# Patient Record
Sex: Male | Born: 1955 | Race: White | Hispanic: No | Marital: Single | Smoking: Never smoker
Health system: Southern US, Community
[De-identification: ages and names within clinical notes are randomized; demographics above are authoritative.]

---

## 2009-01-25 ENCOUNTER — Ambulatory Visit: Payer: Self-pay | Admitting: Family Medicine

## 2009-01-25 DIAGNOSIS — M79609 Pain in unspecified limb: Secondary | ICD-10-CM

## 2009-01-25 DIAGNOSIS — L851 Acquired keratosis [keratoderma] palmaris et plantaris: Secondary | ICD-10-CM

## 2009-01-25 DIAGNOSIS — L259 Unspecified contact dermatitis, unspecified cause: Secondary | ICD-10-CM

## 2009-05-18 ENCOUNTER — Ambulatory Visit: Payer: Self-pay | Admitting: Family Medicine

## 2009-05-18 DIAGNOSIS — R079 Chest pain, unspecified: Secondary | ICD-10-CM

## 2009-05-18 DIAGNOSIS — B360 Pityriasis versicolor: Secondary | ICD-10-CM

## 2009-05-24 ENCOUNTER — Encounter: Payer: Self-pay | Admitting: Family Medicine

## 2015-11-16 ENCOUNTER — Other Ambulatory Visit: Payer: Self-pay

## 2015-11-16 ENCOUNTER — Emergency Department (HOSPITAL_COMMUNITY)
Admission: EM | Admit: 2015-11-16 | Discharge: 2015-11-16 | Disposition: A | Discharge status: Home / Self Care | Payer: BLUE CROSS/BLUE SHIELD | Attending: Emergency Medicine | Admitting: Emergency Medicine

## 2015-11-16 ENCOUNTER — Emergency Department (HOSPITAL_COMMUNITY): Payer: BLUE CROSS/BLUE SHIELD

## 2015-11-16 ENCOUNTER — Encounter (HOSPITAL_COMMUNITY): Payer: Self-pay

## 2015-11-16 DIAGNOSIS — R079 Chest pain, unspecified: Secondary | ICD-10-CM | POA: Diagnosis present

## 2015-11-16 LAB — BASIC METABOLIC PANEL
Anion gap: 10 (ref 5–15)
BUN: 14 mg/dL (ref 6–20)
CALCIUM: 9.2 mg/dL (ref 8.9–10.3)
CO2: 21 mmol/L — ABNORMAL LOW (ref 22–32)
CREATININE: 1.2 mg/dL (ref 0.61–1.24)
Chloride: 106 mmol/L (ref 101–111)
GFR calc non Af Amer: >60 mL/min (ref >60)
Glucose, Bld: 110 mg/dL — ABNORMAL HIGH (ref 65–99)
Potassium: 3.8 mmol/L (ref 3.5–5.1)
SODIUM: 137 mmol/L (ref 135–145)

## 2015-11-16 LAB — CBC
HCT: 42.1 % (ref 39.0–52.0)
Hemoglobin: 14 g/dL (ref 13.0–17.0)
MCH: 30.2 pg (ref 26.0–34.0)
MCHC: 33.3 g/dL (ref 30.0–36.0)
MCV: 90.9 fL (ref 78.0–100.0)
PLATELETS: 120 10*3/uL — AB (ref 150–400)
RBC: 4.63 MIL/uL (ref 4.22–5.81)
RDW: 13.9 % (ref 11.5–15.5)
WBC: 3.9 10*3/uL — AB (ref 4.0–10.5)

## 2015-11-16 LAB — TROPONIN I

## 2015-11-16 MED ORDER — PANTOPRAZOLE SODIUM 20 MG PO TBEC: 20.0000 mg | DELAYED_RELEASE_TABLET | Freq: Every day | ORAL | Status: AC

## 2015-11-16 NOTE — ED Notes (Signed)
Per [facility]: Pt woke up around 0230 stated he had chest pain, central, non radiating. Pt had same symptoms on Saturday, went to urgent care and they stated that he needed to follow up for a stress test. States pain is pressure.  EMS gave 1 nitro, this resolved all pain.  No medical history.  No shortness of breath.

## 2015-11-16 NOTE — ED Notes (Signed)
Patient transported to X-ray 

## 2015-11-16 NOTE — Discharge Instructions (Signed)
Nonspecific Chest Pain  °Chest pain can be caused by many different conditions. There is always a chance that your pain could be related to something serious, such as a heart attack or a blood clot in your lungs. Chest pain can also be caused by conditions that are not life-threatening. If you have chest pain, it is very important to follow up with your health care provider. °CAUSES  °Chest pain can be caused by: °· Heartburn. °· Pneumonia or bronchitis. °· Anxiety or stress. °· Inflammation around your heart (pericarditis) or lung (pleuritis or pleurisy). °· A blood clot in your lung. °· A collapsed lung (pneumothorax). It can develop suddenly on its own (spontaneous pneumothorax) or from trauma to the chest. °· Shingles infection (varicella-zoster virus). °· Heart attack. °· Damage to the bones, muscles, and cartilage that make up your chest wall. This can include: °¨ Bruised bones due to injury. °¨ Strained muscles or cartilage due to frequent or repeated coughing or overwork. °¨ Fracture to one or more ribs. °¨ Sore cartilage due to inflammation (costochondritis). °RISK FACTORS  °Risk factors for chest pain may include: °· Activities that increase your risk for trauma or injury to your chest. °· Respiratory infections or conditions that cause frequent coughing. °· Medical conditions or overeating that can cause heartburn. °· Heart disease or family history of heart disease. °· Conditions or health behaviors that increase your risk of developing a blood clot. °· Having had chicken pox (varicella zoster). °SIGNS AND SYMPTOMS °Chest pain can feel like: °· Burning or tingling on the surface of your chest or deep in your chest. °· Crushing, pressure, aching, or squeezing pain. °· Dull or sharp pain that is worse when you move, cough, or take a deep breath. °· Pain that is also felt in your back, neck, shoulder, or arm, or pain that spreads to any of these areas. °Your chest pain may come and go, or it may stay  constant. °DIAGNOSIS °Lab tests or other studies may be needed to find the cause of your pain. Your health care provider may have you take a test called an ambulatory ECG (electrocardiogram). An ECG records your heartbeat patterns at the time the test is performed. You may also have other tests, such as: °· Transthoracic echocardiogram (TTE). During echocardiography, sound waves are used to create a picture of all of the heart structures and to look at how blood flows through your heart. °· Transesophageal echocardiogram (TEE). This is a more advanced imaging test that obtains images from inside your body. It allows your health care provider to see your heart in finer detail. °· Cardiac monitoring. This allows your health care provider to monitor your heart rate and rhythm in real time. °· Holter monitor. This is a portable device that records your heartbeat and can help to diagnose abnormal heartbeats. It allows your health care provider to track your heart activity for several days, if needed. °· Stress tests. These can be done through exercise or by taking medicine that makes your heart beat more quickly. °· Blood tests. °· Imaging tests. °TREATMENT  °Your treatment depends on what is causing your chest pain. Treatment may include: °· Medicines. These may include: °¨ Acid blockers for heartburn. °¨ Anti-inflammatory medicine. °¨ Pain medicine for inflammatory conditions. °¨ Antibiotic medicine, if an infection is present. °¨ Medicines to dissolve blood clots. °¨ Medicines to treat coronary artery disease. °· Supportive care for conditions that do not require medicines. This may include: °¨ Resting. °¨ Applying heat   or cold packs to injured areas. °¨ Limiting activities until pain decreases. °HOME CARE INSTRUCTIONS °· If you were prescribed an antibiotic medicine, finish it all even if you start to feel better. °· Avoid any activities that bring on chest pain. °· Do not use any tobacco products, including  cigarettes, chewing tobacco, or electronic cigarettes. If you need help quitting, ask your health care provider. °· Do not drink alcohol. °· Take medicines only as directed by your health care provider. °· Keep all follow-up visits as directed by your health care provider. This is important. This includes any further testing if your chest pain does not go away. °· If heartburn is the cause for your chest pain, you may be told to keep your head raised (elevated) while sleeping. This reduces the chance that acid will go from your stomach into your esophagus. °· Make lifestyle changes as directed by your health care provider. These may include: °¨ Getting regular exercise. Ask your health care provider to suggest some activities that are safe for you. °¨ Eating a heart-healthy diet. A registered dietitian can help you to learn healthy eating options. °¨ Maintaining a healthy weight. °¨ Managing diabetes, if necessary. °¨ Reducing stress. °SEEK MEDICAL CARE IF: °· Your chest pain does not go away after treatment. °· You have a rash with blisters on your chest. °· You have a fever. °SEEK IMMEDIATE MEDICAL CARE IF:  °· Your chest pain is worse. °· You have an increasing cough, or you cough up blood. °· You have severe abdominal pain. °· You have severe weakness. °· You faint. °· You have chills. °· You have sudden, unexplained chest discomfort. °· You have sudden, unexplained discomfort in your arms, back, neck, or jaw. °· You have shortness of breath at any time. °· You suddenly start to sweat, or your skin gets clammy. °· You feel nauseous or you vomit. °· You suddenly feel light-headed or dizzy. °· Your heart begins to beat quickly, or it feels like it is skipping beats. °These symptoms may represent a serious problem that is an emergency. Do not wait to see if the symptoms will go away. Get medical help right away. Call your local emergency services (911 in the [place] Do not drive yourself to the hospital. °  °This  information is not intended to replace advice given to you by your health care provider. Make sure you discuss any questions you have with your health care provider. °  °Document Released: 05/17/2005 Document Revised: 08/28/2014 Document Reviewed: 03/13/2014 °Elsevier Interactive Patient Education ©2016 [employer] ° °

## 2015-11-16 NOTE — ED Provider Notes (Signed)
CSN: [id number]     Arrival date & time 11/16/15  0413 History   First MD Initiated Contact with Patient 11/16/15 [phone]     Chief Complaint  Patient presents with  . Chest Pain     (Consider location/radiation/quality/duration/timing/severity/associated sxs/prior Treatment) HPI Comments: Patient presents to the ER for evaluation of chest pain. Patient was seen at urgent care several days ago when he developed chest pain. Patient reports that he had a pressure in his chest that only lasted for approximately 5 minutes. He had blood work and a chest x-ray performed, EKG. He was told that he would need follow-up and they are apparently arranging for outpatient stress test for this week. He was told to come to the ER, however, if the had any further pain. Patient developed pain today once again. He called EMS and was given nitroglycerin, pain did resolve. He has not had any further pain since. Patient denies heart palpitations, shortness of breath, nausea, diaphoresis associated with the pain.  Patient is a [age] y.o. male presenting with chest pain.  Chest Pain   History reviewed. No pertinent past medical history. History reviewed. No pertinent past surgical history. No family history on file. Social History  Substance Use Topics  . Smoking status: Never Smoker   . Smokeless tobacco: Current User  . Alcohol Use: Yes     Comment: 3-4 times a week     Review of Systems  Cardiovascular: Positive for chest pain.  All other systems reviewed and are negative.     Allergies  Review of patient's allergies indicates no known allergies.  Home Medications   Prior to Admission medications   Not on File   BP 121/93 mmHg  Pulse 62  Temp(Src) 98.4 F (36.9 C) (Oral)  Resp 14  SpO2 95% Physical Exam  Constitutional: He is oriented to person, place, and time. He appears well-developed and well-nourished. No distress.  HENT:  Head: Normocephalic and atraumatic.  Right Ear: Hearing normal.   Left Ear: Hearing normal.  Nose: Nose normal.  Mouth/Throat: Oropharynx is clear and moist and mucous membranes are normal.  Eyes: Conjunctivae and EOM are normal. Pupils are equal, round, and reactive to light.  Neck: Normal range of motion. Neck supple.  Cardiovascular: Regular rhythm, S1 normal and S2 normal.  Exam reveals no gallop and no friction rub.   No murmur heard. Pulmonary/Chest: Effort normal and breath sounds normal. No respiratory distress. He exhibits no tenderness.  Abdominal: Soft. Normal appearance and bowel sounds are normal. There is no hepatosplenomegaly. There is no tenderness. There is no rebound, no guarding, no tenderness at McBurney's point and negative Murphy's sign. No hernia.  Musculoskeletal: Normal range of motion.  Neurological: He is alert and oriented to person, place, and time. He has normal strength. No cranial nerve deficit or sensory deficit. Coordination normal. GCS eye subscore is 4. GCS verbal subscore is 5. GCS motor subscore is 6.  Skin: Skin is warm, dry and intact. No rash noted. No cyanosis.  Psychiatric: He has a normal mood and affect. His speech is normal and behavior is normal. Thought content normal.  Nursing note and vitals reviewed.   ED Course  Procedures (including critical care time) Labs Review Labs Reviewed  BASIC METABOLIC PANEL - Abnormal; Notable for the following:    CO2 21 (*)    Glucose, Bld 110 (*)    All other components within normal limits  CBC - Abnormal; Notable for the following:    WBC  3.9 (*)    Platelets 120 (*)    All other components within normal limits  TROPONIN I  Rosezena Sensor, ED    Imaging Review Dg Chest 2 View  11/16/2015  CLINICAL DATA:  Acute onset of centralized chest pressure. Initial encounter. EXAM: CHEST  2 VIEW COMPARISON:  None. FINDINGS: The lungs are well-aerated and clear. There is no evidence of focal opacification, pleural effusion or pneumothorax. The heart is normal in size; the  mediastinal contour is within normal limits. No acute osseous abnormalities are seen. IMPRESSION: No acute cardiopulmonary process seen. Electronically Signed   By: Roanna Raider M.D.   On: 11/16/2015 05:05   I have personally reviewed and evaluated these images and lab results as part of my medical decision-making.   EKG Interpretation   Date/Time:  Tuesday November 16 2015 04:11:47 EDT Ventricular Rate:  69 PR Interval:  204 QRS Duration: 122 QT Interval:  410 QTC Calculation: 439 R Axis:   -42 Text Interpretation:  Normal sinus rhythm Left axis deviation Non-specific  intra-ventricular conduction delay Abnormal ECG No previous tracing  Confirmed by POLLINA  MD, CHRISTOPHER ([id number]) on 11/16/2015 6:23:49 AM      MDM   Final diagnoses:  Chest pain, unspecified chest pain type    Patient presents to the ER for evaluation of chest pain. Patient had acute onset of chest pain earlier that was resolved with nitroglycerin provided by EMS. He had chest pain over the weekend as well. These are the only 2 times he has had chest pain previously, no known coronary artery disease. Patient does not have any significant cardiac risk factors. He denies hypertension, high cholesterol, diabetes, family history. He is not a smoker. He is not obese. Patient is therefore felt to be very low risk. HEART score = 1. Patient's EKG does not show any ischemic changes. Troponin negative. He arrived 2 hours after the pain was present. The lab unfortunately did not run the troponin on his initial blood work. A troponin was therefore drawn at 6 AM. This was 4 hours after the episode of pain.  Gilda Crease, MD 11/16/15 [phone]

## 2020-03-24 ENCOUNTER — Inpatient Hospital Stay (HOSPITAL_COMMUNITY)
Admission: EM | Admit: 2020-03-24 | Discharge: 2020-04-21 | DRG: 853 | Disposition: E | Payer: BLUE CROSS/BLUE SHIELD | Attending: Pulmonary Disease | Admitting: Pulmonary Disease

## 2020-03-24 ENCOUNTER — Other Ambulatory Visit: Payer: Self-pay

## 2020-03-24 DIAGNOSIS — E8770 Fluid overload, unspecified: Secondary | ICD-10-CM | POA: Diagnosis not present

## 2020-03-24 DIAGNOSIS — D6959 Other secondary thrombocytopenia: Secondary | ICD-10-CM | POA: Diagnosis present

## 2020-03-24 DIAGNOSIS — A419 Sepsis, unspecified organism: Secondary | ICD-10-CM | POA: Diagnosis not present

## 2020-03-24 DIAGNOSIS — D684 Acquired coagulation factor deficiency: Secondary | ICD-10-CM | POA: Diagnosis present

## 2020-03-24 DIAGNOSIS — Z66 Do not resuscitate: Secondary | ICD-10-CM | POA: Diagnosis not present

## 2020-03-24 DIAGNOSIS — E860 Dehydration: Secondary | ICD-10-CM | POA: Diagnosis present

## 2020-03-24 DIAGNOSIS — K7031 Alcoholic cirrhosis of liver with ascites: Secondary | ICD-10-CM | POA: Diagnosis present

## 2020-03-24 DIAGNOSIS — R1900 Intra-abdominal and pelvic swelling, mass and lump, unspecified site: Secondary | ICD-10-CM

## 2020-03-24 DIAGNOSIS — D689 Coagulation defect, unspecified: Secondary | ICD-10-CM

## 2020-03-24 DIAGNOSIS — R069 Unspecified abnormalities of breathing: Secondary | ICD-10-CM

## 2020-03-24 DIAGNOSIS — E872 Acidosis, unspecified: Secondary | ICD-10-CM

## 2020-03-24 DIAGNOSIS — S301XXA Contusion of abdominal wall, initial encounter: Secondary | ICD-10-CM | POA: Diagnosis not present

## 2020-03-24 DIAGNOSIS — E861 Hypovolemia: Secondary | ICD-10-CM | POA: Diagnosis present

## 2020-03-24 DIAGNOSIS — R58 Hemorrhage, not elsewhere classified: Secondary | ICD-10-CM | POA: Diagnosis not present

## 2020-03-24 DIAGNOSIS — D539 Nutritional anemia, unspecified: Secondary | ICD-10-CM | POA: Diagnosis present

## 2020-03-24 DIAGNOSIS — E871 Hypo-osmolality and hyponatremia: Secondary | ICD-10-CM

## 2020-03-24 DIAGNOSIS — K7011 Alcoholic hepatitis with ascites: Secondary | ICD-10-CM | POA: Diagnosis present

## 2020-03-24 DIAGNOSIS — E876 Hypokalemia: Secondary | ICD-10-CM | POA: Diagnosis not present

## 2020-03-24 DIAGNOSIS — D62 Acute posthemorrhagic anemia: Secondary | ICD-10-CM | POA: Diagnosis not present

## 2020-03-24 DIAGNOSIS — R739 Hyperglycemia, unspecified: Secondary | ICD-10-CM | POA: Diagnosis present

## 2020-03-24 DIAGNOSIS — E7220 Disorder of urea cycle metabolism, unspecified: Secondary | ICD-10-CM

## 2020-03-24 DIAGNOSIS — R578 Other shock: Secondary | ICD-10-CM | POA: Diagnosis not present

## 2020-03-24 DIAGNOSIS — N179 Acute kidney failure, unspecified: Secondary | ICD-10-CM | POA: Diagnosis present

## 2020-03-24 DIAGNOSIS — X58XXXA Exposure to other specified factors, initial encounter: Secondary | ICD-10-CM | POA: Diagnosis not present

## 2020-03-24 DIAGNOSIS — Z20822 Contact with and (suspected) exposure to covid-19: Secondary | ICD-10-CM | POA: Diagnosis present

## 2020-03-24 DIAGNOSIS — D61818 Other pancytopenia: Secondary | ICD-10-CM | POA: Diagnosis not present

## 2020-03-24 DIAGNOSIS — K661 Hemoperitoneum: Secondary | ICD-10-CM

## 2020-03-24 DIAGNOSIS — K810 Acute cholecystitis: Secondary | ICD-10-CM | POA: Diagnosis present

## 2020-03-24 DIAGNOSIS — Z781 Physical restraint status: Secondary | ICD-10-CM

## 2020-03-24 DIAGNOSIS — F1010 Alcohol abuse, uncomplicated: Secondary | ICD-10-CM | POA: Diagnosis present

## 2020-03-24 DIAGNOSIS — K7041 Alcoholic hepatic failure with coma: Secondary | ICD-10-CM | POA: Diagnosis present

## 2020-03-24 DIAGNOSIS — E8809 Other disorders of plasma-protein metabolism, not elsewhere classified: Secondary | ICD-10-CM

## 2020-03-24 DIAGNOSIS — R7989 Other specified abnormal findings of blood chemistry: Secondary | ICD-10-CM

## 2020-03-24 DIAGNOSIS — K828 Other specified diseases of gallbladder: Secondary | ICD-10-CM | POA: Diagnosis present

## 2020-03-24 DIAGNOSIS — N3289 Other specified disorders of bladder: Secondary | ICD-10-CM | POA: Diagnosis present

## 2020-03-24 DIAGNOSIS — Z452 Encounter for adjustment and management of vascular access device: Secondary | ICD-10-CM

## 2020-03-24 DIAGNOSIS — Z515 Encounter for palliative care: Secondary | ICD-10-CM | POA: Diagnosis not present

## 2020-03-24 DIAGNOSIS — R6521 Severe sepsis with septic shock: Secondary | ICD-10-CM | POA: Diagnosis present

## 2020-03-24 DIAGNOSIS — N19 Unspecified kidney failure: Secondary | ICD-10-CM

## 2020-03-24 DIAGNOSIS — E162 Hypoglycemia, unspecified: Secondary | ICD-10-CM

## 2020-03-24 DIAGNOSIS — K7290 Hepatic failure, unspecified without coma: Secondary | ICD-10-CM

## 2020-03-24 DIAGNOSIS — E875 Hyperkalemia: Secondary | ICD-10-CM | POA: Diagnosis present

## 2020-03-24 DIAGNOSIS — N170 Acute kidney failure with tubular necrosis: Secondary | ICD-10-CM | POA: Diagnosis present

## 2020-03-24 DIAGNOSIS — G9341 Metabolic encephalopathy: Secondary | ICD-10-CM | POA: Diagnosis present

## 2020-03-24 DIAGNOSIS — K766 Portal hypertension: Secondary | ICD-10-CM | POA: Diagnosis present

## 2020-03-24 LAB — CBC WITH DIFFERENTIAL/PLATELET
Abs Immature Granulocytes: 0.07 10*3/uL (ref 0.00–0.07)
Basophils Absolute: 0 10*3/uL (ref 0.0–0.1)
Basophils Relative: 0 %
Eosinophils Absolute: 0 10*3/uL (ref 0.0–0.5)
Eosinophils Relative: 0 %
HCT: 28.2 % — ABNORMAL LOW (ref 39.0–52.0)
Hemoglobin: 9.6 g/dL — ABNORMAL LOW (ref 13.0–17.0)
Immature Granulocytes: 1 %
Lymphocytes Relative: 9 %
Lymphs Abs: 0.5 10*3/uL — ABNORMAL LOW (ref 0.7–4.0)
MCH: 31.4 pg (ref 26.0–34.0)
MCHC: 34 g/dL (ref 30.0–36.0)
MCV: 92.2 fL (ref 80.0–100.0)
Monocytes Absolute: 0.3 10*3/uL (ref 0.1–1.0)
Monocytes Relative: 6 %
Neutro Abs: 5 10*3/uL (ref 1.7–7.7)
Neutrophils Relative %: 84 %
Platelets: 150 10*3/uL (ref 150–400)
RBC: 3.06 MIL/uL — ABNORMAL LOW (ref 4.22–5.81)
RDW: 18.6 % — ABNORMAL HIGH (ref 11.5–15.5)
WBC: 5.9 10*3/uL (ref 4.0–10.5)
nRBC: 0 % (ref 0.0–0.2)

## 2020-03-24 LAB — COMPREHENSIVE METABOLIC PANEL
ALT: <5 U/L (ref 0–44)
AST: 465 U/L — ABNORMAL HIGH (ref 15–41)
Albumin: 1.8 g/dL — ABNORMAL LOW (ref 3.5–5.0)
Alkaline Phosphatase: 509 U/L — ABNORMAL HIGH (ref 38–126)
Anion gap: 21 — ABNORMAL HIGH (ref 5–15)
BUN: 41 mg/dL — ABNORMAL HIGH (ref 8–23)
CO2: 15 mmol/L — ABNORMAL LOW (ref 22–32)
Calcium: 6.7 mg/dL — ABNORMAL LOW (ref 8.9–10.3)
Chloride: 81 mmol/L — ABNORMAL LOW (ref 98–111)
Creatinine, Ser: 1.56 mg/dL — ABNORMAL HIGH (ref 0.61–1.24)
GFR calc Af Amer: 54 mL/min — ABNORMAL LOW (ref >60)
GFR calc non Af Amer: 47 mL/min — ABNORMAL LOW (ref >60)
Glucose, Bld: 61 mg/dL — ABNORMAL LOW (ref 70–99)
Potassium: 5.5 mmol/L — ABNORMAL HIGH (ref 3.5–5.1)
Sodium: 117 mmol/L — CL (ref 135–145)
Total Bilirubin: 12.1 mg/dL — ABNORMAL HIGH (ref 0.3–1.2)
Total Protein: 4.1 g/dL — ABNORMAL LOW (ref 6.5–8.1)

## 2020-03-24 LAB — PROTIME-INR
INR: 3.1 — ABNORMAL HIGH (ref 0.8–1.2)
Prothrombin Time: 30.6 seconds — ABNORMAL HIGH (ref 11.4–15.2)

## 2020-03-24 LAB — AMMONIA: Ammonia: 45 umol/L — ABNORMAL HIGH (ref 9–35)

## 2020-03-24 LAB — ETHANOL: Alcohol, Ethyl (B): <10 mg/dL (ref <10)

## 2020-03-24 LAB — LACTIC ACID, PLASMA: Lactic Acid, Venous: 9.1 mmol/L (ref 0.5–1.9)

## 2020-03-24 MED ORDER — SODIUM CHLORIDE 0.9 % IV BOLUS
500.0000 mL | Freq: Once | INTRAVENOUS | Status: AC
  Administered 2020-03-25: 500 mL via INTRAVENOUS

## 2020-03-24 NOTE — ED Triage Notes (Signed)
Pt presents from home, family took out IVC papers due to patient's alcohol abuse and aggression at home, initial BP 50/30 with EMS, GCS 6, improved in route but uncooperative, pt jaundiced

## 2020-03-25 ENCOUNTER — Inpatient Hospital Stay (HOSPITAL_COMMUNITY): Payer: BLUE CROSS/BLUE SHIELD

## 2020-03-25 ENCOUNTER — Encounter (HOSPITAL_COMMUNITY): Payer: Self-pay | Admitting: Emergency Medicine

## 2020-03-25 ENCOUNTER — Emergency Department (HOSPITAL_COMMUNITY): Payer: BLUE CROSS/BLUE SHIELD

## 2020-03-25 DIAGNOSIS — K810 Acute cholecystitis: Secondary | ICD-10-CM | POA: Diagnosis present

## 2020-03-25 DIAGNOSIS — K7041 Alcoholic hepatic failure with coma: Secondary | ICD-10-CM | POA: Diagnosis present

## 2020-03-25 DIAGNOSIS — K7011 Alcoholic hepatitis with ascites: Secondary | ICD-10-CM | POA: Diagnosis present

## 2020-03-25 DIAGNOSIS — E871 Hypo-osmolality and hyponatremia: Secondary | ICD-10-CM | POA: Diagnosis not present

## 2020-03-25 DIAGNOSIS — E7220 Disorder of urea cycle metabolism, unspecified: Secondary | ICD-10-CM

## 2020-03-25 DIAGNOSIS — A419 Sepsis, unspecified organism: Secondary | ICD-10-CM

## 2020-03-25 DIAGNOSIS — R6521 Severe sepsis with septic shock: Secondary | ICD-10-CM

## 2020-03-25 DIAGNOSIS — D539 Nutritional anemia, unspecified: Secondary | ICD-10-CM | POA: Diagnosis present

## 2020-03-25 DIAGNOSIS — R7989 Other specified abnormal findings of blood chemistry: Secondary | ICD-10-CM | POA: Diagnosis not present

## 2020-03-25 DIAGNOSIS — D61818 Other pancytopenia: Secondary | ICD-10-CM | POA: Diagnosis not present

## 2020-03-25 DIAGNOSIS — X58XXXA Exposure to other specified factors, initial encounter: Secondary | ICD-10-CM | POA: Diagnosis not present

## 2020-03-25 DIAGNOSIS — Z515 Encounter for palliative care: Secondary | ICD-10-CM | POA: Diagnosis not present

## 2020-03-25 DIAGNOSIS — G9341 Metabolic encephalopathy: Secondary | ICD-10-CM

## 2020-03-25 DIAGNOSIS — D689 Coagulation defect, unspecified: Secondary | ICD-10-CM

## 2020-03-25 DIAGNOSIS — D684 Acquired coagulation factor deficiency: Secondary | ICD-10-CM | POA: Diagnosis present

## 2020-03-25 DIAGNOSIS — K7031 Alcoholic cirrhosis of liver with ascites: Secondary | ICD-10-CM | POA: Diagnosis present

## 2020-03-25 DIAGNOSIS — E162 Hypoglycemia, unspecified: Secondary | ICD-10-CM | POA: Diagnosis present

## 2020-03-25 DIAGNOSIS — E872 Acidosis, unspecified: Secondary | ICD-10-CM

## 2020-03-25 DIAGNOSIS — K7290 Hepatic failure, unspecified without coma: Secondary | ICD-10-CM | POA: Diagnosis not present

## 2020-03-25 DIAGNOSIS — R739 Hyperglycemia, unspecified: Secondary | ICD-10-CM | POA: Diagnosis present

## 2020-03-25 DIAGNOSIS — F1010 Alcohol abuse, uncomplicated: Secondary | ICD-10-CM | POA: Diagnosis present

## 2020-03-25 DIAGNOSIS — K828 Other specified diseases of gallbladder: Secondary | ICD-10-CM | POA: Diagnosis present

## 2020-03-25 DIAGNOSIS — K766 Portal hypertension: Secondary | ICD-10-CM | POA: Diagnosis present

## 2020-03-25 DIAGNOSIS — D62 Acute posthemorrhagic anemia: Secondary | ICD-10-CM | POA: Diagnosis not present

## 2020-03-25 DIAGNOSIS — Z66 Do not resuscitate: Secondary | ICD-10-CM | POA: Diagnosis not present

## 2020-03-25 DIAGNOSIS — E875 Hyperkalemia: Secondary | ICD-10-CM | POA: Diagnosis present

## 2020-03-25 DIAGNOSIS — Z20822 Contact with and (suspected) exposure to covid-19: Secondary | ICD-10-CM | POA: Diagnosis present

## 2020-03-25 DIAGNOSIS — N170 Acute kidney failure with tubular necrosis: Secondary | ICD-10-CM | POA: Diagnosis present

## 2020-03-25 DIAGNOSIS — E860 Dehydration: Secondary | ICD-10-CM | POA: Diagnosis present

## 2020-03-25 LAB — HIV ANTIBODY (ROUTINE TESTING W REFLEX): HIV Screen 4th Generation wRfx: NONREACTIVE

## 2020-03-25 LAB — GLUCOSE, CAPILLARY
Glucose-Capillary: 107 mg/dL — ABNORMAL HIGH (ref 70–99)
Glucose-Capillary: 117 mg/dL — ABNORMAL HIGH (ref 70–99)
Glucose-Capillary: 128 mg/dL — ABNORMAL HIGH (ref 70–99)
Glucose-Capillary: 131 mg/dL — ABNORMAL HIGH (ref 70–99)

## 2020-03-25 LAB — COMPREHENSIVE METABOLIC PANEL
ALT: <5 U/L (ref 0–44)
ALT: 120 U/L — ABNORMAL HIGH (ref 0–44)
AST: 458 U/L — ABNORMAL HIGH (ref 15–41)
AST: 643 U/L — ABNORMAL HIGH (ref 15–41)
Albumin: 1.4 g/dL — ABNORMAL LOW (ref 3.5–5.0)
Albumin: 1.7 g/dL — ABNORMAL LOW (ref 3.5–5.0)
Alkaline Phosphatase: 381 U/L — ABNORMAL HIGH (ref 38–126)
Alkaline Phosphatase: 300 U/L — ABNORMAL HIGH (ref 38–126)
Anion gap: 16 — ABNORMAL HIGH (ref 5–15)
Anion gap: 9 (ref 5–15)
BUN: 45 mg/dL — ABNORMAL HIGH (ref 8–23)
BUN: 44 mg/dL — ABNORMAL HIGH (ref 8–23)
CO2: 15 mmol/L — ABNORMAL LOW (ref 22–32)
CO2: 22 mmol/L (ref 22–32)
Calcium: 5.8 mg/dL — CL (ref 8.9–10.3)
Calcium: 5.5 mg/dL — CL (ref 8.9–10.3)
Chloride: 84 mmol/L — ABNORMAL LOW (ref 98–111)
Chloride: 91 mmol/L — ABNORMAL LOW (ref 98–111)
Creatinine, Ser: 1.86 mg/dL — ABNORMAL HIGH (ref 0.61–1.24)
Creatinine, Ser: 1.28 mg/dL — ABNORMAL HIGH (ref 0.61–1.24)
GFR calc Af Amer: 44 mL/min — ABNORMAL LOW (ref >60)
GFR calc Af Amer: >60 mL/min (ref >60)
GFR calc non Af Amer: 38 mL/min — ABNORMAL LOW (ref >60)
GFR calc non Af Amer: 59 mL/min — ABNORMAL LOW (ref >60)
Glucose, Bld: 96 mg/dL (ref 70–99)
Glucose, Bld: 117 mg/dL — ABNORMAL HIGH (ref 70–99)
Potassium: 5.6 mmol/L — ABNORMAL HIGH (ref 3.5–5.1)
Potassium: 3.7 mmol/L (ref 3.5–5.1)
Sodium: 115 mmol/L — CL (ref 135–145)
Sodium: 122 mmol/L — ABNORMAL LOW (ref 135–145)
Total Bilirubin: 9.7 mg/dL — ABNORMAL HIGH (ref 0.3–1.2)
Total Bilirubin: 10.5 mg/dL — ABNORMAL HIGH (ref 0.3–1.2)
Total Protein: 3.1 g/dL — ABNORMAL LOW (ref 6.5–8.1)
Total Protein: 3.3 g/dL — ABNORMAL LOW (ref 6.5–8.1)

## 2020-03-25 LAB — BASIC METABOLIC PANEL
Anion gap: 13 (ref 5–15)
BUN: 39 mg/dL — ABNORMAL HIGH (ref 8–23)
CO2: 16 mmol/L — ABNORMAL LOW (ref 22–32)
Calcium: 5.5 mg/dL — CL (ref 8.9–10.3)
Chloride: 85 mmol/L — ABNORMAL LOW (ref 98–111)
Creatinine, Ser: 1.67 mg/dL — ABNORMAL HIGH (ref 0.61–1.24)
GFR calc Af Amer: 50 mL/min — ABNORMAL LOW (ref >60)
GFR calc non Af Amer: 43 mL/min — ABNORMAL LOW (ref >60)
Glucose, Bld: 227 mg/dL — ABNORMAL HIGH (ref 70–99)
Potassium: 4.6 mmol/L (ref 3.5–5.1)
Sodium: 114 mmol/L — CL (ref 135–145)

## 2020-03-25 LAB — IRON AND TIBC: Iron: 91 ug/dL (ref 45–182)

## 2020-03-25 LAB — DIC (DISSEMINATED INTRAVASCULAR COAGULATION)PANEL
D-Dimer, Quant: 0.89 ug/mL-FEU — ABNORMAL HIGH (ref 0.00–0.50)
Fibrinogen: 122 mg/dL — ABNORMAL LOW (ref 210–475)
INR: 2.1 — ABNORMAL HIGH (ref 0.8–1.2)
Platelets: 51 10*3/uL — ABNORMAL LOW (ref 150–400)
Prothrombin Time: 22.8 seconds — ABNORMAL HIGH (ref 11.4–15.2)
Smear Review: NONE SEEN
aPTT: 70 seconds — ABNORMAL HIGH (ref 24–36)

## 2020-03-25 LAB — CBC
HCT: 19.1 % — ABNORMAL LOW (ref 39.0–52.0)
HCT: 24 % — ABNORMAL LOW (ref 39.0–52.0)
Hemoglobin: 6.5 g/dL — CL (ref 13.0–17.0)
Hemoglobin: 8.5 g/dL — ABNORMAL LOW (ref 13.0–17.0)
MCH: 31.7 pg (ref 26.0–34.0)
MCH: 31.1 pg (ref 26.0–34.0)
MCHC: 34 g/dL (ref 30.0–36.0)
MCHC: 35.4 g/dL (ref 30.0–36.0)
MCV: 93.2 fL (ref 80.0–100.0)
MCV: 87.9 fL (ref 80.0–100.0)
Platelets: 73 10*3/uL — ABNORMAL LOW (ref 150–400)
Platelets: 56 10*3/uL — ABNORMAL LOW (ref 150–400)
RBC: 2.05 MIL/uL — ABNORMAL LOW (ref 4.22–5.81)
RBC: 2.73 MIL/uL — ABNORMAL LOW (ref 4.22–5.81)
RDW: 18.9 % — ABNORMAL HIGH (ref 11.5–15.5)
RDW: 17.4 % — ABNORMAL HIGH (ref 11.5–15.5)
WBC: 3 10*3/uL — ABNORMAL LOW (ref 4.0–10.5)
WBC: 2.4 10*3/uL — ABNORMAL LOW (ref 4.0–10.5)
nRBC: 0 % (ref 0.0–0.2)
nRBC: 0 % (ref 0.0–0.2)

## 2020-03-25 LAB — URINALYSIS, ROUTINE W REFLEX MICROSCOPIC
Glucose, UA: 50 mg/dL — AB
Ketones, ur: NEGATIVE mg/dL
Leukocytes,Ua: NEGATIVE
Nitrite: NEGATIVE
Protein, ur: NEGATIVE mg/dL
Specific Gravity, Urine: 1.023 (ref 1.005–1.030)
pH: 5 (ref 5.0–8.0)

## 2020-03-25 LAB — VOLATILES,BLD-ACETONE,ETHANOL,ISOPROP,METHANOL
Acetone, blood: <0.01 g/dL (ref 0.000–0.010)
Ethanol, blood: <0.01 g/dL (ref 0.000–0.010)
Isopropanol, blood: <0.01 g/dL (ref 0.000–0.010)
Methanol, blood: <0.01 g/dL (ref 0.000–0.010)

## 2020-03-25 LAB — BODY FLUID CELL COUNT WITH DIFFERENTIAL
Lymphs, Fluid: 47 %
Monocyte-Macrophage-Serous Fluid: 15 % — ABNORMAL LOW (ref 50–90)
Neutrophil Count, Fluid: 38 % — ABNORMAL HIGH (ref 0–25)
Total Nucleated Cell Count, Fluid: 205 cu mm (ref 0–1000)

## 2020-03-25 LAB — TSH: TSH: 1.531 u[IU]/mL (ref 0.350–4.500)

## 2020-03-25 LAB — RAPID URINE DRUG SCREEN, HOSP PERFORMED
Amphetamines: NOT DETECTED
Barbiturates: NOT DETECTED
Benzodiazepines: NOT DETECTED
Cocaine: NOT DETECTED
Opiates: NOT DETECTED
Tetrahydrocannabinol: NOT DETECTED

## 2020-03-25 LAB — SODIUM, URINE, RANDOM: Sodium, Ur: <10 mmol/L

## 2020-03-25 LAB — ABO/RH: ABO/RH(D): A POS

## 2020-03-25 LAB — HEPATIC FUNCTION PANEL
ALT: 107 U/L — ABNORMAL HIGH (ref 0–44)
AST: 537 U/L — ABNORMAL HIGH (ref 15–41)
Albumin: 1.4 g/dL — ABNORMAL LOW (ref 3.5–5.0)
Alkaline Phosphatase: 347 U/L — ABNORMAL HIGH (ref 38–126)
Bilirubin, Direct: 6.3 mg/dL — ABNORMAL HIGH (ref 0.0–0.2)
Indirect Bilirubin: 3.5 mg/dL — ABNORMAL HIGH (ref 0.3–0.9)
Total Bilirubin: 9.8 mg/dL — ABNORMAL HIGH (ref 0.3–1.2)
Total Protein: 3.1 g/dL — ABNORMAL LOW (ref 6.5–8.1)

## 2020-03-25 LAB — GLUCOSE, PLEURAL OR PERITONEAL FLUID: Glucose, Fluid: 158 mg/dL

## 2020-03-25 LAB — PROTEIN, PLEURAL OR PERITONEAL FLUID: Total protein, fluid: <3 g/dL

## 2020-03-25 LAB — NA AND K (SODIUM & POTASSIUM), RAND UR
Potassium Urine: 39 mmol/L
Sodium, Ur: <10 mmol/L

## 2020-03-25 LAB — HEPATITIS PANEL, ACUTE
HCV Ab: NONREACTIVE
Hep A IgM: NONREACTIVE
Hep B C IgM: NONREACTIVE
Hepatitis B Surface Ag: NONREACTIVE

## 2020-03-25 LAB — CORTISOL-AM, BLOOD: Cortisol - AM: 31.1 ug/dL — ABNORMAL HIGH (ref 6.7–22.6)

## 2020-03-25 LAB — ALBUMIN, PLEURAL OR PERITONEAL FLUID: Albumin, Fluid: <1 g/dL

## 2020-03-25 LAB — CBG MONITORING, ED
Glucose-Capillary: 133 mg/dL — ABNORMAL HIGH (ref 70–99)
Glucose-Capillary: 160 mg/dL — ABNORMAL HIGH (ref 70–99)

## 2020-03-25 LAB — LIPASE, BLOOD: Lipase: 55 U/L — ABNORMAL HIGH (ref 11–51)

## 2020-03-25 LAB — OSMOLALITY, URINE: Osmolality, Ur: 359 mOsm/kg (ref 300–900)

## 2020-03-25 LAB — HEMOGLOBIN A1C
Hgb A1c MFr Bld: 5.3 % (ref 4.8–5.6)
Mean Plasma Glucose: 105.41 mg/dL

## 2020-03-25 LAB — MRSA PCR SCREENING: MRSA by PCR: NEGATIVE

## 2020-03-25 LAB — PHOSPHORUS: Phosphorus: 4.6 mg/dL (ref 2.5–4.6)

## 2020-03-25 LAB — LACTIC ACID, PLASMA
Lactic Acid, Venous: >11 mmol/L (ref 0.5–1.9)
Lactic Acid, Venous: 7 mmol/L (ref 0.5–1.9)
Lactic Acid, Venous: 2.5 mmol/L (ref 0.5–1.9)

## 2020-03-25 LAB — PREPARE RBC (CROSSMATCH)

## 2020-03-25 LAB — MAGNESIUM: Magnesium: 2.8 mg/dL — ABNORMAL HIGH (ref 1.7–2.4)

## 2020-03-25 LAB — ACETAMINOPHEN LEVEL: Acetaminophen (Tylenol), Serum: <10 ug/mL — ABNORMAL LOW (ref 10–30)

## 2020-03-25 LAB — CK: Total CK: 336 U/L (ref 49–397)

## 2020-03-25 LAB — FERRITIN: Ferritin: 1225 ng/mL — ABNORMAL HIGH (ref 24–336)

## 2020-03-25 LAB — SARS CORONAVIRUS 2 BY RT PCR (HOSPITAL ORDER, PERFORMED IN ~~LOC~~ HOSPITAL LAB): SARS Coronavirus 2: NEGATIVE

## 2020-03-25 LAB — GAMMA GT: GGT: 304 U/L — ABNORMAL HIGH (ref 7–50)

## 2020-03-25 LAB — PROTIME-INR
INR: 4.3 (ref 0.8–1.2)
Prothrombin Time: 39.7 seconds — ABNORMAL HIGH (ref 11.4–15.2)

## 2020-03-25 LAB — OSMOLALITY: Osmolality: 267 mOsm/kg — ABNORMAL LOW (ref 275–295)

## 2020-03-25 MED ORDER — IBUPROFEN 200 MG PO TABS
600.0000 mg | ORAL_TABLET | Freq: Once | ORAL | Status: AC
  Administered 2020-03-25: 600 mg via ORAL
  Filled 2020-03-25: qty 3

## 2020-03-25 MED ORDER — DEXTROSE 50 % IV SOLN
1.0000 | Freq: Once | INTRAVENOUS | Status: AC
  Administered 2020-03-25: 50 mL via INTRAVENOUS
  Filled 2020-03-25: qty 50

## 2020-03-25 MED ORDER — LORAZEPAM 2 MG/ML IJ SOLN: 0.0000 mg | Freq: Four times a day (QID) | INTRAMUSCULAR | Status: DC

## 2020-03-25 MED ORDER — SODIUM BICARBONATE-DEXTROSE 150-5 MEQ/L-% IV SOLN
150.0000 meq | INTRAVENOUS | Status: DC
  Administered 2020-03-25 (×2): 150 meq via INTRAVENOUS
  Filled 2020-03-25 (×4): qty 1000

## 2020-03-25 MED ORDER — POLYETHYLENE GLYCOL 3350 17 G PO PACK: 17.0000 g | PACK | Freq: Every day | ORAL | Status: DC | PRN

## 2020-03-25 MED ORDER — SODIUM CHLORIDE 0.9 % IV SOLN
2.0000 g | Freq: Two times a day (BID) | INTRAVENOUS | Status: DC
  Administered 2020-03-25 – 2020-03-29 (×8): 2 g via INTRAVENOUS
  Filled 2020-03-25 (×8): qty 2

## 2020-03-25 MED ORDER — VANCOMYCIN HCL IN DEXTROSE 1-5 GM/200ML-% IV SOLN: 1000.0000 mg | Freq: Once | INTRAVENOUS | Status: DC

## 2020-03-25 MED ORDER — SODIUM CHLORIDE 0.9 % IV SOLN: INTRAVENOUS | Status: DC

## 2020-03-25 MED ORDER — SODIUM CHLORIDE 0.9 % IV SOLN
250.0000 mL | INTRAVENOUS | Status: DC
  Administered 2020-03-25 (×2): 250 mL via INTRAVENOUS

## 2020-03-25 MED ORDER — CHLORHEXIDINE GLUCONATE 0.12 % MT SOLN
15.0000 mL | Freq: Two times a day (BID) | OROMUCOSAL | Status: DC
  Administered 2020-03-25 – 2020-03-29 (×5): 15 mL via OROMUCOSAL
  Filled 2020-03-25 (×5): qty 15

## 2020-03-25 MED ORDER — LACTATED RINGERS IV SOLN: INTRAVENOUS | Status: DC

## 2020-03-25 MED ORDER — SODIUM CHLORIDE 0.9 % IV BOLUS (SEPSIS)
1000.0000 mL | Freq: Once | INTRAVENOUS | Status: AC
  Administered 2020-03-25: 1000 mL via INTRAVENOUS

## 2020-03-25 MED ORDER — SODIUM CHLORIDE 0.9 % IV SOLN
250.0000 mL | INTRAVENOUS | Status: DC
  Administered 2020-03-25: 250 mL via INTRAVENOUS

## 2020-03-25 MED ORDER — "THROMBI-PAD 3""X3"" EX PADS"
1.0000 | MEDICATED_PAD | Freq: Once | CUTANEOUS | Status: AC
  Administered 2020-03-26: 1 via TOPICAL
  Filled 2020-03-25 (×2): qty 1

## 2020-03-25 MED ORDER — VITAMIN K1 10 MG/ML IJ SOLN
5.0000 mg | Freq: Every day | INTRAVENOUS | Status: AC
  Administered 2020-03-25 – 2020-03-27 (×2): 5 mg via INTRAVENOUS
  Filled 2020-03-25 (×3): qty 0.5

## 2020-03-25 MED ORDER — "THROMBI-PAD 3""X3"" EX PADS"
1.0000 | MEDICATED_PAD | Freq: Once | CUTANEOUS | Status: AC
  Administered 2020-03-25: 1 via TOPICAL
  Filled 2020-03-25: qty 1

## 2020-03-25 MED ORDER — ONDANSETRON HCL 4 MG/2ML IJ SOLN: 4.0000 mg | Freq: Four times a day (QID) | INTRAMUSCULAR | Status: DC | PRN

## 2020-03-25 MED ORDER — HALOPERIDOL LACTATE 5 MG/ML IJ SOLN: 2.0000 mg | Freq: Once | INTRAMUSCULAR | Status: DC

## 2020-03-25 MED ORDER — THIAMINE HCL 100 MG/ML IJ SOLN
Freq: Once | INTRAVENOUS | Status: AC
  Filled 2020-03-25: qty 1000

## 2020-03-25 MED ORDER — PANTOPRAZOLE SODIUM 40 MG IV SOLR: 40.0000 mg | Freq: Every day | INTRAVENOUS | Status: DC

## 2020-03-25 MED ORDER — SODIUM CHLORIDE 0.9% IV SOLUTION: Freq: Once | INTRAVENOUS | Status: AC

## 2020-03-25 MED ORDER — SODIUM CHLORIDE 0.9 % IV BOLUS
1000.0000 mL | Freq: Once | INTRAVENOUS | Status: AC
  Administered 2020-03-25: 1000 mL via INTRAVENOUS

## 2020-03-25 MED ORDER — LORAZEPAM 2 MG/ML IJ SOLN
1.0000 mg | Freq: Once | INTRAMUSCULAR | Status: AC
  Administered 2020-03-25: 1 mg via INTRAVENOUS
  Filled 2020-03-25: qty 1

## 2020-03-25 MED ORDER — IOHEXOL 300 MG/ML  SOLN
100.0000 mL | Freq: Once | INTRAMUSCULAR | Status: AC | PRN
  Administered 2020-03-25: 80 mL via INTRAVENOUS

## 2020-03-25 MED ORDER — VITAMIN K1 10 MG/ML IJ SOLN
5.0000 mg | Freq: Once | INTRAVENOUS | Status: AC
  Administered 2020-03-25: 5 mg via INTRAVENOUS
  Filled 2020-03-25: qty 0.5

## 2020-03-25 MED ORDER — CHLORHEXIDINE GLUCONATE CLOTH 2 % EX PADS
6.0000 | MEDICATED_PAD | Freq: Every day | CUTANEOUS | Status: DC
  Administered 2020-03-25 – 2020-03-28 (×4): 6 via TOPICAL

## 2020-03-25 MED ORDER — ADULT MULTIVITAMIN W/MINERALS CH
1.0000 | ORAL_TABLET | Freq: Every day | ORAL | Status: DC
  Administered 2020-03-26 – 2020-03-27 (×2): 1 via ORAL
  Filled 2020-03-25 (×2): qty 1

## 2020-03-25 MED ORDER — FOLIC ACID 1 MG PO TABS
1.0000 mg | ORAL_TABLET | Freq: Every day | ORAL | Status: DC
  Administered 2020-03-26 – 2020-03-27 (×2): 1 mg via ORAL
  Filled 2020-03-25 (×2): qty 1

## 2020-03-25 MED ORDER — LACTATED RINGERS IV BOLUS (SEPSIS): 1000.0000 mL | Freq: Once | INTRAVENOUS | Status: DC

## 2020-03-25 MED ORDER — PANTOPRAZOLE SODIUM 40 MG IV SOLR
40.0000 mg | Freq: Two times a day (BID) | INTRAVENOUS | Status: DC
  Administered 2020-03-25 – 2020-03-29 (×8): 40 mg via INTRAVENOUS
  Filled 2020-03-25 (×8): qty 40

## 2020-03-25 MED ORDER — INSULIN ASPART 100 UNIT/ML ~~LOC~~ SOLN
0.0000 [IU] | SUBCUTANEOUS | Status: DC
  Administered 2020-03-25: 2 [IU] via SUBCUTANEOUS
  Administered 2020-03-25: 3 [IU] via SUBCUTANEOUS
  Administered 2020-03-25 – 2020-03-28 (×6): 2 [IU] via SUBCUTANEOUS
  Administered 2020-03-28 (×3): 3 [IU] via SUBCUTANEOUS
  Filled 2020-03-25: qty 0.15

## 2020-03-25 MED ORDER — THIAMINE HCL 100 MG PO TABS
100.0000 mg | ORAL_TABLET | Freq: Every day | ORAL | Status: DC
  Administered 2020-03-26 – 2020-03-27 (×2): 100 mg via ORAL
  Filled 2020-03-25 (×2): qty 1

## 2020-03-25 MED ORDER — THIAMINE HCL 100 MG/ML IJ SOLN
100.0000 mg | Freq: Every day | INTRAMUSCULAR | Status: DC
  Administered 2020-03-25 – 2020-03-28 (×2): 100 mg via INTRAVENOUS
  Filled 2020-03-25 (×2): qty 2

## 2020-03-25 MED ORDER — NOREPINEPHRINE 4 MG/250ML-% IV SOLN
INTRAVENOUS | Status: AC
  Administered 2020-03-25: 4 mg via INTRAVENOUS
  Filled 2020-03-25: qty 250

## 2020-03-25 MED ORDER — CEFEPIME HCL 2 G IJ SOLR
2.0000 g | Freq: Once | INTRAMUSCULAR | Status: AC
  Administered 2020-03-25: 2 g via INTRAVENOUS
  Filled 2020-03-25: qty 2

## 2020-03-25 MED ORDER — CALCIUM GLUCONATE-NACL 2-0.675 GM/100ML-% IV SOLN
2.0000 g | Freq: Once | INTRAVENOUS | Status: AC
  Administered 2020-03-25: 2000 mg via INTRAVENOUS
  Filled 2020-03-25: qty 100

## 2020-03-25 MED ORDER — VANCOMYCIN HCL 1500 MG/300ML IV SOLN
1500.0000 mg | Freq: Once | INTRAVENOUS | Status: AC
  Administered 2020-03-25: 1500 mg via INTRAVENOUS
  Filled 2020-03-25: qty 300

## 2020-03-25 MED ORDER — NOREPINEPHRINE 4 MG/250ML-% IV SOLN
2.0000 ug/min | INTRAVENOUS | Status: DC
  Administered 2020-03-25 (×2): 4 ug/min via INTRAVENOUS
  Filled 2020-03-25: qty 250

## 2020-03-25 MED ORDER — LORAZEPAM 1 MG PO TABS
0.0000 mg | ORAL_TABLET | Freq: Four times a day (QID) | ORAL | Status: DC
  Administered 2020-03-25: 2 mg via ORAL
  Filled 2020-03-25: qty 2

## 2020-03-25 MED ORDER — ORAL CARE MOUTH RINSE
15.0000 mL | Freq: Two times a day (BID) | OROMUCOSAL | Status: DC
  Administered 2020-03-26 – 2020-03-29 (×3): 15 mL via OROMUCOSAL

## 2020-03-25 MED ORDER — ORAL CARE MOUTH RINSE
15.0000 mL | Freq: Two times a day (BID) | OROMUCOSAL | Status: DC
  Administered 2020-03-25 – 2020-03-29 (×4): 15 mL via OROMUCOSAL

## 2020-03-25 MED ORDER — METRONIDAZOLE IN NACL 5-0.79 MG/ML-% IV SOLN
500.0000 mg | Freq: Once | INTRAVENOUS | Status: AC
  Administered 2020-03-25: 500 mg via INTRAVENOUS
  Filled 2020-03-25: qty 100

## 2020-03-25 MED ORDER — SODIUM CHLORIDE (PF) 0.9 % IJ SOLN
INTRAMUSCULAR | Status: AC
  Filled 2020-03-25: qty 50

## 2020-03-25 MED ORDER — DOCUSATE SODIUM 100 MG PO CAPS: 100.0000 mg | ORAL_CAPSULE | Freq: Two times a day (BID) | ORAL | Status: DC | PRN

## 2020-03-25 MED ORDER — LACTATED RINGERS IV BOLUS (SEPSIS): 500.0000 mL | Freq: Once | INTRAVENOUS | Status: DC

## 2020-03-25 MED ORDER — LORAZEPAM 1 MG PO TABS: 0.0000 mg | ORAL_TABLET | Freq: Two times a day (BID) | ORAL | Status: DC

## 2020-03-25 MED ORDER — INSULIN ASPART 100 UNIT/ML ~~LOC~~ SOLN
5.0000 [IU] | Freq: Once | SUBCUTANEOUS | Status: AC
  Administered 2020-03-25: 5 [IU] via SUBCUTANEOUS
  Filled 2020-03-25: qty 0.05

## 2020-03-25 MED ORDER — SODIUM CHLORIDE 0.9% IV SOLUTION: Freq: Once | INTRAVENOUS | Status: DC

## 2020-03-25 MED ORDER — LORAZEPAM 2 MG/ML IJ SOLN: 0.0000 mg | Freq: Two times a day (BID) | INTRAMUSCULAR | Status: DC

## 2020-03-25 MED ORDER — ALBUMIN HUMAN 25 % IV SOLN
25.0000 g | Freq: Once | INTRAVENOUS | Status: AC
  Administered 2020-03-25: 25 g via INTRAVENOUS
  Filled 2020-03-25: qty 100

## 2020-03-25 MED ORDER — METRONIDAZOLE IN NACL 5-0.79 MG/ML-% IV SOLN
500.0000 mg | Freq: Three times a day (TID) | INTRAVENOUS | Status: DC
  Administered 2020-03-25 – 2020-03-28 (×11): 500 mg via INTRAVENOUS
  Filled 2020-03-25 (×12): qty 100

## 2020-03-25 MED ORDER — DEXTROSE 50 % IV SOLN
50.0000 mL | Freq: Once | INTRAVENOUS | Status: AC
  Administered 2020-03-25: 50 mL via INTRAVENOUS
  Filled 2020-03-25: qty 50

## 2020-03-25 NOTE — Procedures (Signed)
Central Venous Catheter Insertion Procedure Note TIQUAN BOUCH [id number] 1956/03/10  Procedure: Insertion of Central Venous Catheter Indications: Assessment of intravascular volume  Procedure Details Consent: Risks of procedure as well as the alternatives and risks of each were explained to the (patient/caregiver).  Consent for procedure obtained. Time Out: Verified patient identification, verified procedure, site/side was marked, verified correct patient position, special equipment/implants available, medications/allergies/relevent history reviewed, required imaging and test results available.  Performed  Maximum sterile technique was used including antiseptics, cap, gloves, gown, hand hygiene, mask and sheet. Skin prep: Chlorhexidine; local anesthetic administered A antimicrobial bonded/coated triple lumen catheter was placed in the left subclavian vein using the Seldinger technique.    Evaluation Blood flow good Complications: No apparent complications Patient did tolerate procedure well. Chest X-ray ordered to verify placement.  CXR: pending.  Heber [place] 03/25/2020, 12:59 PM

## 2020-03-25 NOTE — ED Provider Notes (Signed)
[facility] DEPT Provider Note   CSN: [id number] Arrival date & time: 03/25/2020  2210     History Chief Complaint  Patient presents with  . Near Syncope  . Mental Health Problem    Cory Aguirre is a 64 y.o. male.  Patient to ED by EMS under IVC by family. History per EMS patient found to be hypotensive on scene, lightheaded when sitting up. Angry and aggressive that family is insisting on medical evaluation and care. IVC in place siting "danger to himself". The patient is not providing significant detail of his condition but denies pain, vomiting.   The history is provided by the patient and medical records. No language interpreter was used.  Near Syncope  Mental Health Problem      History reviewed. No pertinent past medical history.  Patient Active Problem List   Diagnosis Date Noted  . 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 CHEST PAIN UNSPECIFIED 05/18/2009  . ECZEMA 01/25/2009  . KERATOSIS 01/25/2009  . FOOT PAIN 01/25/2009    History reviewed. No pertinent surgical history.     History reviewed. No pertinent family history.  Social History   Tobacco Use  . Smoking status: Never Smoker  . Smokeless tobacco: Current User  Substance Use Topics  . Alcohol use: Yes    Comment: 3-4 times a week   . Drug use: No    Home Medications Prior to Admission medications   Medication Sig Start Date End Date Taking? Authorizing Provider  pantoprazole (PROTONIX) 20 MG tablet Take 1 tablet (20 mg total) by mouth daily. Patient not taking: Reported on 04/12/2020 11/16/15   Orpah Greek, MD    Allergies    Patient has no known allergies.  Review of Systems   Review of Systems  Unable to perform ROS: Other (patient does not contribute information)  Cardiovascular: Positive for near-syncope.    Physical Exam Updated Vital Signs BP 110/66   Pulse (!) 104   Temp 98.6 F (37 C)   Resp (!) 28   Ht 5' 9"  (1.753 m)   Wt 79.4 kg    SpO2 98%   BMI 25.84 kg/m   Physical Exam  ED Results / Procedures / Treatments   Labs (all labs ordered are listed, but only abnormal results are displayed) Labs Reviewed  CBC WITH DIFFERENTIAL/PLATELET - Abnormal; Notable for the following components:      Result Value   RBC 3.06 (*)    Hemoglobin 9.6 (*)    HCT 28.2 (*)    RDW 18.6 (*)    Lymphs Abs 0.5 (*)    All other components within normal limits  COMPREHENSIVE METABOLIC PANEL - Abnormal; Notable for the following components:   Sodium 117 (*)    Potassium 5.5 (*)    Chloride 81 (*)    CO2 15 (*)    Glucose, Bld 61 (*)    BUN 41 (*)    Creatinine, Ser 1.56 (*)    Calcium 6.7 (*)    Total Protein 4.1 (*)    Albumin 1.8 (*)    AST 465 (*)    Alkaline Phosphatase 509 (*)    Total Bilirubin 12.1 (*)    GFR calc non Af Amer 47 (*)    GFR calc Af Amer 54 (*)    Anion gap 21 (*)    All other components within normal limits  AMMONIA - Abnormal; Notable for the following components:   Ammonia 45 (*)  All other components within normal limits  LACTIC ACID, PLASMA - Abnormal; Notable for the following components:   Lactic Acid, Venous 9.1 (*)    All other components within normal limits  PROTIME-INR - Abnormal; Notable for the following components:   Prothrombin Time 30.6 (*)    INR 3.1 (*)    All other components within normal limits  URINE CULTURE  CULTURE, BLOOD (ROUTINE X 2)  CULTURE, BLOOD (ROUTINE X 2)  ETHANOL  URINALYSIS, ROUTINE W REFLEX MICROSCOPIC  LACTIC ACID, PLASMA  COMPREHENSIVE METABOLIC PANEL  RAPID URINE DRUG SCREEN, HOSP PERFORMED    EKG EKG Interpretation  Date/Time:  Thursday March 25 2020 00:54:05 EDT Ventricular Rate:  108 PR Interval:    QRS Duration: 132 QT Interval:  351 QTC Calculation: 471 R Axis:   -14 Text Interpretation: Sinus tachycardia Nonspecific intraventricular conduction delay Partial missing lead(s): V6 Confirmed by Dory Horn) on 03/25/2020 1:20:09  AM   Radiology DG Chest Portable 1 View  Result Date: 03/25/2020 CLINICAL DATA:  Sepsis EXAM: PORTABLE CHEST 1 VIEW COMPARISON:  11/16/2015 FINDINGS: The heart size and mediastinal contours are within normal limits. Both lungs are clear. The visualized skeletal structures are unremarkable. Calcification of the aorta. IMPRESSION: No active disease. Electronically Signed   By: Lucienne Capers M.D.   On: 03/25/2020 01:07    Procedures Procedures (including critical care time) CRITICAL CARE Performed by: Dewaine Oats   Total critical care time: 50 minutes  Critical care time was exclusive of separately billable procedures and treating other patients.  Critical care was necessary to treat or prevent imminent or life-threatening deterioration.  Critical care was time spent personally by me on the following activities: development of treatment plan with patient and/or surrogate as well as nursing, discussions with consultants, evaluation of patient's response to treatment, examination of patient, obtaining history from patient or surrogate, ordering and performing treatments and interventions, ordering and review of laboratory studies, ordering and review of radiographic studies, pulse oximetry and re-evaluation of patient's condition.  Medications Ordered in ED Medications  lactated ringers infusion (has no administration in time range)  metroNIDAZOLE (FLAGYL) IVPB 500 mg (500 mg Intravenous New Bag/Given (Non-Interop) 03/25/20 0043)  LORazepam (ATIVAN) injection 0-4 mg (has no administration in time range)    Or  LORazepam (ATIVAN) tablet 0-4 mg (has no administration in time range)  LORazepam (ATIVAN) injection 0-4 mg (has no administration in time range)    Or  LORazepam (ATIVAN) tablet 0-4 mg (has no administration in time range)  thiamine tablet 100 mg (has no administration in time range)    Or  thiamine (B-1) injection 100 mg (has no administration in time range)  vancomycin  (VANCOREADY) IVPB 1500 mg/300 mL (1,500 mg Intravenous New Bag/Given 03/25/20 0046)  dextrose 5 % and 0.45% NaCl 1,000 mL with thiamine 056 mg, folic acid 1 mg, multivitamins adult 10 mL, magnesium sulfate 2 g infusion (has no administration in time range)  sodium chloride 0.9 % bolus 1,000 mL (1,000 mLs Intravenous New Bag/Given 03/25/20 0055)    Followed by  sodium chloride 0.9 % bolus 1,000 mL (has no administration in time range)  sodium chloride (PF) 0.9 % injection (has no administration in time range)  sodium chloride 0.9 % bolus 500 mL (500 mLs Intravenous New Bag/Given (Non-Interop) 03/25/20 0039)  ceFEPIme (MAXIPIME) 2 g in sodium chloride 0.9 % 100 mL IVPB (2 g Intravenous New Bag/Given (Non-Interop) 03/25/20 0041)  LORazepam (ATIVAN) injection 1 mg (1 mg  Intravenous Given 03/25/20 0058)  dextrose 50 % solution 50 mL (50 mLs Intravenous Given 03/25/20 0040)    ED Course  I have reviewed the triage vital signs and the nursing notes.  Pertinent labs & imaging results that were available during my care of the patient were reviewed by me and considered in my medical decision making (see chart for details).    MDM Rules/Calculators/A&P                          In the ED, the patient is jaundiced, pale, angry, uncooperative with history but is allowing IV and lab draw, imaging.   Labs significantly concerning for severe hyponatremia (117), T.bili 12.1, AST 465 while ALT <5, alk phos 509, AKI, hypoglycemia, auto anticoagulated with INR 3.1, anemic. INitial lactic acid 9.1. Rectal temp 101.5.  Initially hypotensive, quickly responds to fluid resuscitation, last 139/87. UA pending. CXR clear.   Critical care paged for consultation. Patient remains awake without change in mental status, maintaining airway.   Blood pressure continues to improve. No mental status changes. Discussed with CC (2:45) who will see the patient for admission.   Final Clinical Impression(s) / ED Diagnoses Final diagnoses:   None   1. Liver failure 2. Sepsis 3. Hypoglycemia 4. Dehydration  Rx / DC Orders ED Discharge Orders    None       Charlann Lange, PA-C 03/25/20 [id number]    Malvin Johns, MD 04/01/20 1940

## 2020-03-25 NOTE — Procedures (Signed)
[facility] Paracentesis note  Indication: septic shock, ascites, likely SBP  The patient's family (Benita) was informed of the risks and gave consent, see written chart.   Full barrier precautions including sterile gloves, gown, mask cap, needles, syringes used to maintain sterility throughout the procedure.    The best pocket of fluid was identified in the left lower quadrant by ultrasound using sterile ultrasound gel.  The pocket was small but free of subcutaneous vasculature or bowel.  Then 1% lidocaine was injected into the skin.  Then a 21 gauge needle and catheter were inserted into the fluid and 7cc of yellow peritoneal fluid was removed.    Complications: none  Specimen sent for cell count with differential, glucose, albumin, protein, culture  Heber Wiley, MD [facility] Pager: [phone] Cell: [phone] If no response, call [phone]

## 2020-03-25 NOTE — Progress Notes (Signed)
eLink Physician-Brief Progress Note Patient Name: Cory Aguirre DOB: September 01, 1955 MRN: [id number]   Date of Service  03/25/2020  HPI/Events of Note  Lactic acid  2.5, Albumin 1.7, serum calcium 5.5, corrected calcium 7.3  eICU Interventions  Calcium gluconate 2 gm iv x 1, Albumin 25 % 25 gm iv x 1.        Thomasene Lot Kersten Salmons 03/25/2020, 9:12 PM

## 2020-03-25 NOTE — Progress Notes (Signed)
Pt remaining hypotensive, BP 84/55 after 1L NS bolus. Anders Simmonds, critical care NP notified by this RN. Verbal orders received to begin levo gtt peripherally until central line placed. Started by this RN at 2 mcg/ min. This RN will continue to monitor patient's response to interventions.

## 2020-03-25 NOTE — Progress Notes (Signed)
Pharmacy Antibiotic Note  Cory Aguirre is a 64 y.o. male admitted on 03/26/2020 with sepsis.  Pharmacy has been consulted for Cefepime dosing.  Febrile WBC WNL, LA elevated Scr trending UP  Plan: Cefepime 2gm IV q12h Monitor renal function and cx data    Height: 5\' 9"  (175.3 cm) Weight: 79.4 kg (175 lb) IBW/kg (Calculated) : 70.7  Temp (24hrs), Avg:100.1 F (37.8 C), Min:98.6 F (37 C), Max:101.5 F (38.6 C)  Recent Labs  Lab 04/05/2020 2300 03/25/20 0214  WBC 5.9  --   CREATININE 1.56* 1.86*  LATICACIDVEN 9.1* >11.0*    Estimated Creatinine Clearance: 40.7 mL/min (A) (by C-G formula based on SCr of 1.86 mg/dL (H)).    No Known Allergies  Antimicrobials this admission: 8/5 Cefepime >>  8/5 Flagyl >>  8/5 Vanc x1  Dose adjustments this admission:  Microbiology results: 8/5 BCx:  UCx:   Thank you for allowing pharmacy to be a part of this patient's care.  10/5 PharmD, BCPS 03/25/2020 4:47 AM

## 2020-03-25 NOTE — ED Notes (Signed)
Date and time results received: 03/25/20 *0545 (use smartphrase ".now" to insert current time)  Test: *hemoglobin Critical Value: 6.5  Name of Provider Notified: Marcelo Baldy  Orders Received? Or Actions Taken?: type and screen order, transfusion ordered

## 2020-03-25 NOTE — Progress Notes (Signed)
Pt is hypothermic, core temp 93.7 degrees farenheit. CCM notified by this RN; Lawyer already in place. This RN will continue to carefully monitor pt.

## 2020-03-25 NOTE — Progress Notes (Signed)
Informed consent for Paracentesis and Central line obtained by this RN and Anders Simmonds, NP. Patient's sister [name], [place] for pt, gave consent over the phone, as witnessed by the above persons. Consent placed in patient's chart. This RN will continue to carefully monitor pt.

## 2020-03-25 NOTE — Progress Notes (Signed)
A consult was received from an ED physician for Vancomycin & Cefepime per pharmacy dosing.  The patient's profile has been reviewed for ht/wt/allergies/indication/available labs.   A one time order has been placed for Vancomycin 1500mg  & Cefepime 2gm IV.  Further antibiotics/pharmacy consults should be ordered by admitting physician if indicated.                       Thank you, PharmD 03/25/2020  12:14 AM

## 2020-03-25 NOTE — H&P (Addendum)
NAME:  Cory Aguirre, MRN:  [id number], DOB:  26-Jan-1956, LOS: 0 ADMISSION DATE:  03/28/2020, CONSULTATION DATE:  03/25/20  REFERRING MD:  Charlann Lange PA-C CHIEF COMPLAINT:  Sepsis   Brief History   GADGE HERMIZ is a 64 y.o. male with h/o alcohol abuse who was admitted from the ED after being found hypotensive and altered in his home. Initial BP was 50s/30s, which subsequently responded well to volume resuscitation. He was found to be febrile to 101.5 F. Labs notable for hyponatremia and significantly elevated LFTs and lactic acidosis.    History of present illness   Cory Aguirre is a 64 y.o. male with h/o alcohol abuse who was admitted from the ED after being found hypotensive and altered in his home. Initial BP was 50s/30s, which subsequently responded well to volume resuscitation. Although he was altered, he was maintaining with airway and following some commands. He is unable to provide any history. His sister, Margarita Grizzle, reports that he was not drinking alcohol today. Last alcohol was 3 days ago, a small amount of bourbon mixed with ginger ale given by his family due to concerns that he would develop DTs. She is not aware of any prior history of alcohol withdrawal or DTs. She states that he does not drink wood, grain or rubbing alcohol. She reports that he hasn't been feeling well over the past week. She states that he has had loose, dark stools and nausea without vomiting. He has not been eating or drinking much over the last week. He has lost 70 lbs over the last 6 months. He apparently became limp and clammy today while he was on the toilet, then had a syncopal episode, which prompted EMS to take him to the ED.  ED course notable for fever to 101.79F, tachycardia and tachypnea, with normal SpO2 on room air. Labs notable for Na 117, K 5.5, Cl 81, CO2 15, glucose 61, BUN 41, creatinine 1.56, anion gap 21, TBili 12.1, alk phos 509, AST 465, ALT <5, albumin 1.8, ammonia 45. Initial lactic acid 9.1. Hgb  9.5, Plt 150, INR 3.1. EtOH level <10, APAP level <10. CT a/p with contrast showed hepatosplenomegaly with abdominopelvic ascites, thickening of the jejunum and colon 2/2 enterocolitis versus portal enteropathy, and hyperdense sludge within the gallbladder. CT head showed chronic microvascular disease without acute findings. CXR showed no acute abnormalities. He received cefepime, Flagyl, vancomycin and 2.5L NS bolus while in the ED. He received 2 doses of IV Ativan for agitation.  Past Medical History  Alcohol abuse  Significant Hospital Events   None  Consults:  PCCM  Procedures:  None  Significant Diagnostic Tests:  CT a/p with contrast (8/5): IMPRESSION: 1. Small right greater than left pleural effusions. 2. Suspected liver disease. There is splenomegaly and moderate volume of ascites within the abdomen and pelvis. 3. Diffuse fluid within the small bowel. Mild thickened appearance of left upper quadrant jejunal small bowel loops with mild wall thickening of the ascending colon and hepatic flexure. Findings could be secondary to enteritis/colitis versus portal enteropathy. 4. Hyperdense sludge within the gallbladder. Possible gallbladder wall thickening or edema, correlation with right upper quadrant ultrasound could be obtained as indicated  Micro Data:  Blood cultures (8/5) pending  Antimicrobials:  Vancomycin (8/5) Cefepime, metronidazole (8/5-)   Interim history/subjective:  N/A  Objective   Blood pressure 103/67, pulse (!) 101, temperature (!) 101.5 F (38.6 C), temperature source Rectal, resp. rate (!) 36, height '5\' 9"'$  (1.753 m), weight  79.4 kg, SpO2 92 %.        Intake/Output Summary (Last 24 hours) at 03/25/2020 0442 Last data filed at 03/25/2020 0256 Gross per 24 hour  Intake 3000 ml  Output --  Net 3000 ml   Filed Weights   03/29/2020 2236  Weight: 79.4 kg    Examination: General: ill-appearing male, NAD HENT: [place]/AT, (+) scleral icterus Lungs: CTA  b/l, no W/C/R Cardiovascular: tachycardia, no M/R/G Abdomen: distended, diffusely tender to palpation, NABS Extremities: no C/C/E Neuro: intermittently follows commands, not verbalizing, moves all extremities GU: deferred  Resolved Hospital Problem list   N/A  Assessment & Plan:  1. Septic shock, intra-abdominal source 2. Lactic acidosis Lactate >11 despite adequate volume resuscitation. Not requiring pressors but initially profoundly hypotensive. Serum osmolality would argue against volatile metabolites. - Continue cefepime, metronidazole; can stop vancomycin given suspected intra-abdominal source - Obtain RUQ U/S to better evaluate gallbladder for cholecystitis - Follow up blood, urine cultures; obtain C diff, GIP - Volume resuscitation - Trend lactate - Check lipase - Follow up volatile metabolites - Daily CBC, trend fever curve  3. Elevated LFTs 4. History of alcohol abuse 5. Supratherapeutic INR In the setting of suspected chronic liver disease with acute hepatitis. Will need to uncover etiology of acute decompensation in liver function. Initial imaging not consistent with cirrhotic morphology, however, ascites and splenomegaly are suggestive of chronic liver disease, as is coagulopathy. MELDNa score 36. - GI consult in AM - RUQ U/S as above - Check HIV, hepatitis panel, iron profile - Add-on GGT - Low suspicion for DILI, will hold off on NAC - Follow up urine drug screen - CIWA protocol - Thiamine, folate, MVI - Vitamin K 5 mg x3d - No indication for lactulose at this time - Daily CMP, INR  6. AKI 7. Hyponatremia, Hyperkalemia History and exam would be most suggest of hypovolemic hyponatremia. Could also be related to chronic liver disease, however only prior sodium level from 2017 was normal. Initial serum osmolality 267. Urine osmolality and lytes are pending due to lack of urine sample. - Volume resuscitation with 0.9% NS - No indication for hypertonic saline at  this point - BMP Q4H - Check AM cortisol, TSH  8. Syncope Likely orthostatic versus vasovagal in setting of recent poor PO intake. - Volume resuscitation as above - Orthostatic vitals when able - Follow up urine drug screen - CT head wnl - Can consider TTE for completeness, however, unlikely to be implicated  9. Microcytic anemia - Continue home PPI - Trend H&H   Best practice:  Diet: NPO Pain/Anxiety/Delirium protocol (if indicated): N/A VAP protocol (if indicated): N/A DVT prophylaxis: SCD GI prophylaxis: PPI Glucose control: SSI Mobility: Bed rest Code Status: Full Code Family Communication: Sister, Raina Mina Disposition: Admit to ICU  Labs   CBC: Recent Labs  Lab 04/20/2020 2300  WBC 5.9  NEUTROABS 5.0  HGB 9.6*  HCT 28.2*  MCV 92.2  PLT 272    Basic Metabolic Panel: Recent Labs  Lab 04/12/2020 2300 03/25/20 0214  NA 117* 115*  K 5.5* 5.6*  CL 81* 84*  CO2 15* 15*  GLUCOSE 61* 96  BUN 41* 45*  CREATININE 1.56* 1.86*  CALCIUM 6.7* 5.8*   GFR: Estimated Creatinine Clearance: 40.7 mL/min (A) (by C-G formula based on SCr of 1.86 mg/dL (H)). Recent Labs  Lab 04/19/2020 2300 03/25/20 0214  WBC 5.9  --   LATICACIDVEN 9.1* >11.0*    Liver Function Tests: Recent Labs  Lab  04/02/2020 2300 03/25/20 0214  AST 465* 458*  ALT <5 <5  ALKPHOS 509* 381*  BILITOT 12.1* 9.7*  PROT 4.1* 3.1*  ALBUMIN 1.8* 1.4*   No results for input(s): LIPASE, AMYLASE in the last 168 hours. Recent Labs  Lab 03/29/2020 2300  AMMONIA 45*    ABG No results found for: PHART, PCO2ART, PO2ART, HCO3, TCO2, ACIDBASEDEF, O2SAT   Coagulation Profile: Recent Labs  Lab 04/05/2020 2300  INR 3.1*    Cardiac Enzymes: No results for input(s): CKTOTAL, CKMB, CKMBINDEX, TROPONINI in the last 168 hours.  HbA1C: No results found for: HGBA1C  CBG: No results for input(s): GLUCAP in the last 168 hours.  Review of Systems:   Unable to obtain due to patient status.  Past  Medical History  He,  has no past medical history on file.   Surgical History   History reviewed. No pertinent surgical history.   Social History   reports that he has never smoked. He uses smokeless tobacco. He reports current alcohol use. He reports that he does not use drugs.   Family History   His family history is not on file.   Allergies No Known Allergies   Home Medications  Prior to Admission medications   Medication Sig Start Date End Date Taking? Authorizing Provider  pantoprazole (PROTONIX) 20 MG tablet Take 1 tablet (20 mg total) by mouth daily. Patient not taking: Reported on 03/31/2020 11/16/15   Orpah Greek, MD     Critical care time: 60 minutes

## 2020-03-25 NOTE — Progress Notes (Signed)
Pt came up to ICU hypotensive even with NS infusing @125  cc/hr, current BP 86/61 (68). Dr. notified by this RN; orders received to infuse 1L NS bolus. This RN will continue to monitor pt response to intervention.

## 2020-03-25 NOTE — Progress Notes (Addendum)
NAME:  Cory Aguirre, MRN:  [id number], DOB:  12/28/1955, LOS: 0 ADMISSION DATE:  04/04/2020, CONSULTATION DATE:  03/25/20  REFERRING MD:  Elpidio Anis PA-C CHIEF COMPLAINT:  Sepsis   Brief History   Cory Aguirre is a 64 y.o. male with h/o alcohol abuse who was admitted from the ED after being found hypotensive and altered in his home. Initial BP was 50s/30s, which subsequently responded well to volume resuscitation. He was found to be febrile to 101.5 F. Labs notable for hyponatremia and significantly elevated LFTs and lactic acidosis.    ->prodrome of about 1 week not feeling well; loose dark stools, nausea poor po intake. 70 lb wt loss last 66mo. Syncopal episode on toilet.  Admitted w/ sepsis/septic shock working dx source was enterocolitis vs portal enteropathy +/- SBP; IVFs started, cultures sent. abx initiated.   Past Medical History  Alcohol abuse  Significant Hospital Events   8/5 admitted. Volume responsive.   Consults:  PCCM Surg, IR and GI all consulted.   Procedures:  None  Significant Diagnostic Tests:  CT a/p with contrast (8/5):IMPRESSION:1. Small right greater than left pleural effusions.2. Suspected liver disease. There is splenomegaly and moderate volume of ascites within the abdomen and pelvis.3. Diffuse fluid within the small bowel. Mild thickened appearanceof left upper quadrant jejunal small bowel loops with mild wallthickening of the ascending colon and hepatic flexure. Findingscould be secondary to enteritis/colitis versus portal enteropathy.4. Hyperdense sludge within the gallbladder. Possible gallbladderwall thickening or edema, correlation with right upper quadrant ultrasound could be obtained as indicated RUQ [place] 8/5: 1. Gallbladder is filled with sludge, thickened wall, and pericholecystic edema. Positive Murphy's sign. Changes may represent acute cholecystitis in the appropriate clinical setting. 2. Heterogeneous liver parenchymal echotexture suggesting  cirrhosis or fatty infiltration. 3. Upper abdominal ascites. Micro Data:  Blood cultures (8/5) pending  Antimicrobials:  Vancomycin (8/5) Cefepime, metronidazole (8/5-)   Interim history/subjective:  Minimally responsive   Objective   Blood pressure (Abnormal) 86/61, pulse 72, temperature (Abnormal) 95.7 F (35.4 C), temperature source Rectal, resp. rate (Abnormal) 21, height 5\' 9"  (1.753 m), weight 63.9 kg, SpO2 94 %.        Intake/Output Summary (Last 24 hours) at 03/25/2020 1048 Last data filed at 03/25/2020 0900 Gross per 24 hour  Intake 3770.65 ml  Output no documentation  Net 3770.65 ml   Filed Weights   03/29/2020 2236 03/25/20 0900  Weight: 79.4 kg 63.9 kg    Examination: General this is a chronically ill appearing jaundice white male. Not in acute distress but is encephalopathic  HENT sclera are icteric mucous membranes dry  Pulm decreased t/o no accessory use currently on room air  Card RRR  abd obese + bowel sounds are hypoactive shifting dullness gu cnc yellow  Neuro obtunded moves all ext generalized weakness Ext diffuse areas of ecchymosis   Resolved Hospital Problem list   N/A  Assessment & Plan:   Septic shock w/ severe lactic acidosis , intra-abdominal source: acute cholecystitis vs SBP +/- colitis  abd 05/25/20 Gallbladder is filled with sludge, thickened wall, and pericholecystic edema. Positive Murphy's sign. Lactic acid slow to cl w/ underlying liver dysfxn.  Reviewed the imaging w/ IR they did not think this was r/t GB and were concerned about possible SBP Plan Surgical consult -->suspect given how sick he is will end up w/ perc drain but need to r/o SBP; IF the ascites has bile then this could represent a GB that already ruptured BUT according  to my conversation w/ [facility] radiographic findings other than murphy's sign raise concern for other etiology  GI consult Cont IVFs Add norepi for MAP > 65 Day 1 vanc/cefepime and flagyl  F/u cdiff F/u  lactate   acute on chronic liver disease and possibly acute cholecystis  MELDNa score 36 Plan F/u hepatitis panel Serial LFTs Will ask GI and general surg to see.  Case reviewed w/ IR see above   Syncope Likely orthostatic versus vasovagal in setting of recent poor PO intake. Plan Cont volume replacement  Orthostatics   AKI w/ metabolic acidosis and Hyponatremia: History and exam would be most suggest of hypovolemic hyponatremia. Could also be related to chronic liver disease, however only prior sodium level from 2017 was normal. Initial serum osmolality 267. 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  -not convinced bleeding. Suspect initial findings were hemoconcentration  Plan Transfuse 2nd unit of blood.  Cont PPI and trend cbc  Mild hyperglycemia Plan Trend cbg ssi (sensitive scale)  Best practice:  Diet: NPO Pain/Anxiety/Delirium protocol (if indicated): N/A VAP protocol (if indicated): N/A DVT prophylaxis: SCD GI prophylaxis: PPI Glucose control: SSI Mobility: Bed rest Code Status: Full Code Family Communication: Neldon Labella Disposition: Admit to ICU  Labs   CBC: Recent Labs  Lab 04/11/2020 2300 03/25/20 0506  WBC 5.9 3.0*  NEUTROABS 5.0  --   HGB 9.6* 6.5*  HCT 28.2* 19.1*  MCV 92.2 93.2  PLT 150 73*    Basic Metabolic Panel: Recent Labs  Lab 03/31/2020 2300 03/25/20 0214 03/25/20 0506 03/25/20 0619  NA 117* 115*  --  114*  K 5.5* 5.6*  --  4.6  CL 81* 84*  --  85*  CO2 15* 15*  --  16*  GLUCOSE 61* 96  --  227*  BUN 41* 45*  --  39*    CREATININE 1.56* 1.86*  --  1.67*  CALCIUM 6.7* 5.8*  --  5.5*  MG  --   --  2.8*  --   PHOS  --   --  4.6  --    GFR: Estimated Creatinine Clearance: 40.9 mL/min (A) (by C-G formula based on SCr of 1.67 mg/dL (H)). Recent Labs  Lab 04/08/2020 2300 03/25/20 0214 03/25/20 0506  WBC 5.9  --  3.0*  LATICACIDVEN 9.1* >11.0* 7.0*    Liver Function Tests: Recent Labs  Lab 04/05/2020 2300 03/25/20 0214 03/25/20 0506  AST 465* 458* 537*  ALT <5 <5 107*  ALKPHOS 509* 381* 347*  BILITOT 12.1* 9.7* 9.8*  PROT 4.1* 3.1* 3.1*  ALBUMIN 1.8* 1.4* 1.4*   Recent Labs  Lab 03/25/20 0506  LIPASE 55*   Recent Labs  Lab 04/15/2020 2300  AMMONIA 45*    ABG No results found for: PHART, PCO2ART, PO2ART, HCO3, TCO2, ACIDBASEDEF, O2SAT   Coagulation Profile: Recent Labs  Lab 04/05/2020 2300 03/25/20 0506  INR 3.1* 4.3*      CBG: Recent Labs  Lab 03/25/20 0458 03/25/20 0749  GLUCAP 133* 160*     My cct 45 minutes.     Theron Arista  Duncan Dull ACNP-BC [facility] Pulmonary/Critical Care Pager # [phone] OR # [phone] if no answer

## 2020-03-25 NOTE — ED Notes (Signed)
Pt was given a urinal for the urine sample. He's not able to void at the moment.

## 2020-03-25 NOTE — Progress Notes (Signed)
[facility] PCCM  Levophed requirements improved Peritoneal fluid analysis not consistent with SBP Receiving blood products now Considering uncertain cause of sepsis, plan HIDA per general surgery recommendations Awaiting CVP measurement  Heber [place], MD [place] PCCM Pager: [phone] Cell: [phone] If no response, call [phone]

## 2020-03-25 NOTE — Progress Notes (Signed)
eLink Physician-Brief Progress Note Patient Name: Cory Aguirre DOB: September 26, 1955 MRN: [id number]   Date of Service  03/25/2020  HPI/Events of Note  Acute liver failure in the context of ETOH abuse and ETOH liver disease, he is also likely septic which may be contributing to his liver failure and coagulopathy, but other etiologies including toxins cannot be ruled out at this time. He is actively bl;eeding from his CVC site and last recorded INR is > 4.  eICU Interventions  Stat DIC panel, Vitamin K 5 mg iv x1 now, If Fibrinogen is low will consider transfusing Cryoprecipitate in addition to FFP.        Cory Aguirre 03/25/2020, 8:42 PM

## 2020-03-25 NOTE — Consult Note (Addendum)
Consultation  Referring Provider: Dr. Lake Bells     Primary Care Physician:  Eulas Post, MD Primary Gastroenterologist:  Althia Forts       Reason for Consultation: Elevated LFT's, abnormal imaging of the abdomen, sepsis             HPI:   Cory Aguirre is a 64 y.o. male with a past medical history as listed below including alcohol abuse, who was admitted from the ED after being found hypotensive and altered at home.  We are consulted in regards to significantly elevated LFTs and abnormal imaging of the abdomen as well as sepsis.    At time of my exam patient did not wake up to verbal stimuli.  History is garnered from previous physicians notes.    Patient was seen in the ED after being found hypotensive and altered in his home with initial BPs 50s/30s, subsequently responded well to volume resuscitation.  Although he was altered he was maintaining his airway and following some commands but unable to provide history.  Apparently his sister reported that he was not drinking alcohol today, last alcohol was 3 days ago, small amount of bourbon mixed with ginger ale given by his family due to concerns that he would develop DTs.  Apparently he had been feeling well over the last week and had loose, dark stools with nausea but no vomiting.  He had not been eating or drinking.  He had lost a total of 70 pounds over the past 6 months and apparently became limp and clammy today while on the toilet and then had a syncopal episode which prompted them to call EMS.  ER course: Fever up to 101.5, tachycardia and tachypnea, normal SPO2 on room air, labs notable for sodium 117, potassium 5.5, chloride 81, CO2 15, glucose 61, BUN 41, creatinine 1.56, anion gap 21, T bili 12.1, alk phos 509, AST 465, ALT 105, albumin 1.8, ammonia 45, hemoglobin 9.5, platelets 150, INR 3.1, alcohol level less than 10; CTAP with contrast showed hepatosplenomegaly with abdominal pelvic ascites, thickening of the jejunum and colon  2/2 enterocolitis versus portal neuropathy and hyperdense sludge within the gallbladder  Past medical history: Unable to obtain as patient is obtunded  Past surgical history: Unable to obtain as patient is obtunded  Family history: Unable to obtain as patient is obtunded  Social History   Tobacco Use  . Smoking status: Never Smoker  . Smokeless tobacco: Current User  Substance Use Topics  . Alcohol use: Yes    Comment: 3-4 times a week   . Drug use: No    Prior to Admission medications   Medication Sig Start Date End Date Taking? Authorizing Provider  pantoprazole (PROTONIX) 20 MG tablet Take 1 tablet (20 mg total) by mouth daily. Patient not taking: Reported on 04/17/2020 11/16/15   Orpah Greek, MD    Current Facility-Administered Medications  Medication Dose Route Frequency Provider Last Rate Last Admin  . 0.9 %  sodium chloride infusion (Manually program via Guardrails IV Fluids)   Intravenous Once Bennie Pierini, MD      . 0.9 %  sodium chloride infusion (Manually program via Guardrails IV Fluids)   Intravenous Once Erick Colace, NP      . 0.9 %  sodium chloride infusion  250 mL Intravenous Continuous Erick Colace, NP      . 0.9 %  sodium chloride infusion  250 mL Intravenous Continuous Erick Colace, NP      .  ceFEPIme (MAXIPIME) 2 g in sodium chloride 0.9 % 100 mL IVPB  2 g Intravenous Q12H Thomes Lolling, RPH      . chlorhexidine (PERIDEX) 0.12 % solution 15 mL  15 mL Mouth Rinse BID Dwyane Dee, MD   15 mL at 03/25/20 1011  . Chlorhexidine Gluconate Cloth 2 % PADS 6 each  6 each Topical Daily Bennie Pierini, MD   6 each at 03/25/20 [phone]  . docusate sodium (COLACE) capsule 100 mg  100 mg Oral BID PRN Bennie Pierini, MD      . folic acid (FOLVITE) tablet 1 mg  1 mg Oral Daily Bennie Pierini, MD      . insulin aspart (novoLOG) injection 0-15 Units  0-15 Units Subcutaneous Q4H Bennie Pierini, MD   2 Units at 03/25/20 1225  .  [START ON 03/27/2020] LORazepam (ATIVAN) tablet 0-4 mg  0-4 mg Oral Q12H Schertz, Michele Mcalpine, MD      . MEDLINE mouth rinse  15 mL Mouth Rinse BID Bennie Pierini, MD      . MEDLINE mouth rinse  15 mL Mouth Rinse q12n4p Dwyane Dee, MD      . metroNIDAZOLE (FLAGYL) IVPB 500 mg  500 mg Intravenous Q8H Bennie Pierini, MD   Stopped at 03/25/20 [phone]  . multivitamin with minerals tablet 1 tablet  1 tablet Oral Daily Bennie Pierini, MD      . norepinephrine (LEVOPHED) 14m in 2581mpremix infusion  2-10 mcg/min Intravenous Titrated BaErick ColaceNP 22.5 mL/hr at 03/25/20 1227 6 mcg/min at 03/25/20 1227  . ondansetron (ZOFRAN) injection 4 mg  4 mg Intravenous Q6H PRN ScBennie PieriniMD      . pantoprazole (PROTONIX) injection 40 mg  40 mg Intravenous Q12H ScBennie PieriniMD   40 mg at 03/25/20 [id number]. phytonadione (VITAMIN K) 5 mg in dextrose 5 % 50 mL IVPB  5 mg Intravenous Daily ScBennie PieriniMD   Stopped at 03/25/20 [phone]. polyethylene glycol (MIRALAX / GLYCOLAX) packet 17 g  17 g Oral Daily PRN ScBennie PieriniMD      . sodium bicarbonate 150 mEq in dextrose 5% 1000 mL infusion  150 mEq Intravenous Continuous GiDwyane DeeMD 125 mL/hr at 03/25/20 1300 Restarted at 03/25/20 1300  . sodium chloride (PF) 0.9 % injection           . thiamine tablet 100 mg  100 mg Oral Daily ScBennie PieriniMD       Or  . thiamine (B-1) injection 100 mg  100 mg Intravenous Daily ScBennie PieriniMD   100 mg at 03/25/20 1011    Allergies as of 04/03/2020  . (No Known Allergies)     Review of Systems:    Unable to obtain   Physical Exam:  Vital signs in last 24 hours: Temp:  [93.7 F (34.3 C)-101.5 F (38.6 C)] 95.4 F (35.2 C) (08/05 1300) Pulse Rate:  [67-116] 73 (08/05 1300) Resp:  [18-40] 24 (08/05 1300) BP: (78-139)/(48-87) 98/68 (08/05 1300) SpO2:  [92 %-100 %] 93 % (08/05 1300) Weight:  [63.9 kg-79.4 kg] 63.9 kg (08/05 0900) Last BM Date: 03/25/20 General:    Ill-appearing Caucasian male, appears to be in NAD, Well developed, Well nourished, unresponsive Head:  Normocephalic and atraumatic. Eyes:   PEERL, EOMI. +scleral icterus. Conjunctiva pink. Neck:  Supple Lungs: Respirations even and unlabored. Lungs clear to auscultation bilaterally.  No wheezes, crackles, or rhonchi.  Heart: Normal S1, S2. No MRG. tachycardia Abdomen:  Soft, nondistended, possible tenderness in epigastrum/RUQ-some involuntary guarding. Normal bowel sounds.  Rectal:  Not performed.  Msk:  Symmetrical without gross deformities. Peripheral pulses intact.  Neurologic:  Only moves slightly to verbal stimuli Skin:   Dry and intact without significant lesions or rashes. Psychiatric: minimally responsive  LAB RESULTS: Recent Labs    04/13/2020 2300 03/25/20 0506  WBC 5.9 3.0*  HGB 9.6* 6.5*  HCT 28.2* 19.1*  PLT 150 73*   BMET Recent Labs    04/10/2020 2300 03/25/20 0214 03/25/20 0619  NA 117* 115* 114*  K 5.5* 5.6* 4.6  CL 81* 84* 85*  CO2 15* 15* 16*  GLUCOSE 61* 96 227*  BUN 41* 45* 39*  CREATININE 1.56* 1.86* 1.67*  CALCIUM 6.7* 5.8* 5.5*   Hepatic Function Latest Ref Rng & Units 03/25/2020 03/25/2020 04/16/2020  Total Protein 6.5 - 8.1 g/dL 3.1(L) 3.1(L) 4.1(L)  Albumin 3.5 - 5.0 g/dL 1.4(L) 1.4(L) 1.8(L)  AST 15 - 41 U/L 537(H) 458(H) 465(H)  ALT 0 - 44 U/L 107(H) <5 <5  Alk Phosphatase 38 - 126 U/L 347(H) 381(H) 509(H)  Total Bilirubin 0.3 - 1.2 mg/dL 9.8(H) 9.7(H) 12.1(H)  Bilirubin, Direct 0.0 - 0.2 mg/dL 6.3(H) - -   PT/INR Recent Labs    04/20/2020 2300 03/25/20 0506  LABPROT 30.6* 39.7*  INR 3.1* 4.3*    STUDIES: CT Head Wo Contrast  Result Date: 03/25/2020 CLINICAL DATA:  Mental status change EXAM: CT HEAD WITHOUT CONTRAST TECHNIQUE: Contiguous axial images were obtained from the base of the skull through the vertex without intravenous contrast. COMPARISON:  None. FINDINGS: Brain: No acute territorial infarction, hemorrhage, or intracranial  mass. Mild atrophy. Mild hypodensity in the white matter consistent with chronic small vessel ischemic change. Nonenlarged ventricles. Vascular: No hyperdense vessels.  Carotid vascular calcification Skull: Normal. Negative for fracture or focal lesion. Sinuses/Orbits: No acute finding. Other: None IMPRESSION: 1. No CT evidence for acute intracranial abnormality. 2. Atrophy and mild chronic small vessel ischemic changes of the white matter. Electronically Signed   By: Donavan Foil M.D.   On: 03/25/2020 02:56   CT ABDOMEN PELVIS W CONTRAST  Result Date: 03/25/2020 CLINICAL DATA:  Jaundice EXAM: CT ABDOMEN AND PELVIS WITH CONTRAST TECHNIQUE: Multidetector CT imaging of the abdomen and pelvis was performed using the standard protocol following bolus administration of intravenous contrast. CONTRAST:  56m OMNIPAQUE IOHEXOL 300 MG/ML  SOLN COMPARISON:  None. FINDINGS: Lower chest: Lung bases demonstrate small bilateral pleural effusions. Cardiac size within normal limits. Hepatobiliary: No focal hepatic abnormality. Hyperdense sludge or vicarious contrast excretion within the gallbladder. Possible gallbladder wall thickening or edema. Possible contour nodularity of the liver. Pancreas: Unremarkable. No pancreatic ductal dilatation or surrounding inflammatory changes. Spleen: Enlarged up to 15 cm. Adrenals/Urinary Tract: Adrenal glands are normal. Kidneys show no hydronephrosis. The urinary bladder is unremarkable. Stomach/Bowel: Stomach nonenlarged. Thickened slightly dilated appearing jejunal small bowel loops in the left upper quadrant. Generalized fluid-filled small bowel without well-defined transition. Mild wall thickening of the hepatic flexure and ascending colon. Negative appendix. Vascular/Lymphatic: Moderate aortic atherosclerosis. No aneurysm. No suspicious nodes Reproductive: Prostate is unremarkable.  Prostate calcifications. Other: No free air. Moderate volume of ascites within the abdomen and pelvis  Musculoskeletal: No acute or suspicious osseous abnormality. IMPRESSION: 1. Small right greater than left pleural effusions. 2. Suspected liver disease. There is splenomegaly and moderate volume of ascites within the abdomen and pelvis.  3. Diffuse fluid within the small bowel. Mild thickened appearance of left upper quadrant jejunal small bowel loops with mild wall thickening of the ascending colon and hepatic flexure. Findings could be secondary to enteritis/colitis versus portal enteropathy. 4. Hyperdense sludge within the gallbladder. Possible gallbladder wall thickening or edema, correlation with right upper quadrant ultrasound could be obtained as indicated Aortic Atherosclerosis (ICD10-I70.0). Electronically Signed   By: Donavan Foil M.D.   On: 03/25/2020 03:35   DG Chest Portable 1 View  Result Date: 03/25/2020 CLINICAL DATA:  Sepsis EXAM: PORTABLE CHEST 1 VIEW COMPARISON:  11/16/2015 FINDINGS: The heart size and mediastinal contours are within normal limits. Both lungs are clear. The visualized skeletal structures are unremarkable. Calcification of the aorta. IMPRESSION: No active disease. Electronically Signed   By: Lucienne Capers M.D.   On: 03/25/2020 01:07   US Abdomen Limited RUQ  Result Date: 03/25/2020 CLINICAL DATA:  Elevated liver function studies.  Severe jaundice. EXAM: ULTRASOUND ABDOMEN LIMITED RIGHT UPPER QUADRANT COMPARISON:  CT 03/25/2020 FINDINGS: Gallbladder: Gallbladder is filled with sludge with thickened wall and pericholecystic edema. Murphy's sign is positive. No discrete stones are identified. Common bile duct: Diameter: 5 mm, normal Liver: Heterogeneous liver parenchymal echotexture suggesting cirrhosis or fatty infiltration. No focal lesions identified. Portal vein is patent on color Doppler imaging with normal direction of blood flow towards the liver. Other: Upper abdominal ascites is present. IMPRESSION: 1. Gallbladder is filled with sludge, thickened wall, and  pericholecystic edema. Positive Murphy's sign. Changes may represent acute cholecystitis in the appropriate clinical setting. 2. Heterogeneous liver parenchymal echotexture suggesting cirrhosis or fatty infiltration. 3. Upper abdominal ascites. Electronically Signed   By: Lucienne Capers M.D.   On: 03/25/2020 05:49    Impression / Plan:   Impression: 1.  Septic shock, intra-abdominal source: Currently on cefepime and metronidazole, right upper quadrant ultrasound with gallbladder sludge and a thickened wall with pericholecystic edema and positive Murphy sign 2.  Elevated LFTs: See labs and ultrasound above as well as CT; consider acute alcoholic hepatitis, MELD 36 3.  Supratherapeutic INR: Likely related to liver disease 4.  History of alcohol abuse 5.  AKI 6.  Hyponatremia, hyperkalemia 7.  Syncope 8.  Macrocytic anemia  Plan: 1.  Agree with diagnostic paracentesis to consider SBP 2.  Continue current supportive measures 3.  Please await any further recommendations from Dr. Ardis Hughs later today.  Thank you for your kind consultation, we will continue to follow.  Cory Aguirre  03/25/2020, 1:14 PM  ________________________________________________________________________  Velora Heckler GI MD note:  I personally examined the patient, reviewed the data and agree with the assessment and plan described above.  I spoke with his mother on the phone and him in the room and could never really get a solid assessment of how much etoh he drinks.  I suspect however that he drinks a lot and that much of his acute (and chronic) illness is related to alcoholic liver disease, possibly culminating in hepatic failure now.  AST is higher than we usually see but viral hepatitis panel is negative and tylenol level zero.  I will add some autoimmune markers to bloodwork as well as some other less common viral markers.  So far ascites appears sterile. Awaiting culture and G stain.  Lack of abd pain argues against  cholecystitis.   If this truly is all etoh hepatitis then his discriminant function is VERY high 130s and would qualify him for steroids but we need to be certain of no  underlying infections first.  Agree with broad spectrum Abx, reversing INR with Vit K, supportive care in ICU.  Watch for DTs.   Cory Loffler, MD [facility] Pager [phone]

## 2020-03-25 NOTE — ED Notes (Signed)
RN reports that room will be ready at 0830

## 2020-03-25 NOTE — Consult Note (Signed)
[facility] Surgery Consult Note  Cory Aguirre [birth year]  [id number].    Requesting MD: Marni Griffon NP CCM Chief Complaint: AMS/hypotension Reason for Consult: Acute cholecystitis  HPI:  Patient is a 64 year old male who presented from home via EMS.  The family took out IVC papers due to the patient's alcohol abuse and aggression.  The history came from the sister.  When EMS arrived he was hypotensive blood pressure in the 50/30 range, with a GCS of 6.  Patient was uncooperative and jaundice.  Notes from Dr. Jonnie Finner, [place], notes he was found hypotensive with altered mental status.  He responded well to volume resuscitation.  He had a temperature of 101.5.  He had not taken any alcohol for the last 3 days and the family actually gave him some bourbon mixed with ginger ale at home due to concerns that he would develop DTs.  His sister was not aware of prior alcohol withdrawal or DTs.  She also states that he does not drink wood, grain, or rubbing alcohol.  He has been sick over the past week; loose dark stools, nausea without vomiting, he is not been eating or drinking much over the last week.  His sister also reports a 70 pound weight loss over the last 6 months.  He apparently became limp and clammy while on the toilet and then had a syncopal episode prior to EMS arrival and transported to the ED.  Work-up in the ED: Temperature 101.5, respiratory rate is initially 36, heart rate 116, blood pressures in the 130s.  With fluid hydration his heart rate and respiratory rate of improved but he remains hypotensive.  We will last blood pressure was 84/55.  Sats are good on room air.  Admission labs shows a sodium of 117, potassium of 5.5, CO2 of 15, glucose of 61, BUN 41, creatinine 1.56>>1.86,, alk phos 509>>381>>347, AST 465>>458,  ALT < 5 x 2.  Ammonia was 45.  Total bilirubin 12.1.>>9.7>>9.8,  Lactate 9.1>>>11.0>>7.0,  INR 3.1>>4.3.  Ethanol <10, Tylenol<10, hepatitis panel is negative.  GGT 304,  drug screen is negative.  WBC 5.9>> 3.0.  H/H 9.6/28>> 6.5/19.1.  Platelets 150 K>> 70 3K.  Chest x-ray shows no active disease.  CT of the head shows no acute intracranial abnormalities, atrophy/mild chronic small vessel ischemic changes of white matter.  CT of the abdomen shows no hepatic abnormality, hyperdense sludge or vicarious contrast excretion of the gallbladder possible gallbladder wall thickening or edema possible contour nodularity of the liver.  Pancreas was unremarkable there was no pancreatic ductal dilatation.  Spleen was enlarged to 15 cm.  Stomach was not enlarged slightly thickened dilated appearing jejunal small bowel loops in the upper left quadrant.  Generalized fluid of the small bowel without well-defined transition.  Mild wall thickening of the hepatic flexure and ascending colon.  Negative appendix.  There is a moderate volume of ascites in the abdomen pelvis.  Gallbladder ultrasound shows the gallbladder is filled with sludge, thickened wall and.  Cholecystic edema, positive Murphy sign changes may represent acute cholecystitis.  Heterogeneous liver parenchymal suggestive of cirrhosis or fatty infiltration, upper abdominal ascites.  We are asked to see to evaluate for cholecystitis.  ROS: Review of Systems  Unable to perform ROS: Mental status change    History reviewed. No pertinent family history.  History reviewed. No pertinent past medical history.  History reviewed. No pertinent surgical history.  Social History:  reports that he has never smoked. He uses smokeless tobacco. He reports  current alcohol use. He reports that he does not use drugs.  Allergies: No Known Allergies  Medications Prior to Admission  Medication Sig Dispense Refill  . pantoprazole (PROTONIX) 20 MG tablet Take 1 tablet (20 mg total) by mouth daily. (Patient not taking: Reported on 04/13/2020) 30 tablet 0    Blood pressure (!) 84/55, pulse 67, temperature (!) 93.9 F (34.4 C), temperature  source Bladder, resp. rate 20, height _0  (1.753 m), weight 63.9 kg, SpO2 94 %. Physical Exam: Now on Levophed and BP is better General: pleasant, thin, jaundice white male who is laying in bed in NAD HEENT: head is normocephalic, atraumatic.  Sclera are jaundice.  Pupils are equal.  Ears and nose without any masses or lesions.  Mouth is pink and moist Heart: regular, rate, and rhythm.  Normal s1,s2. No obvious murmurs, gallops, or rubs noted.  Palpable radial and pedal pulses bilaterally Lungs: CTAB, no wheezes, rhonchi, or rales noted.  Respiratory effort nonlabored Abd: soft, NT, ND, +BS, no masses, hernias, or organomegaly.  He denies any pain currently on exam. Foley in place MS: all 4 extremities are symmetrical with no cyanosis, clubbing, or edema. Skin: warm and dry with no masses, lesions, or rashes Neuro: Cranial nerves 2-12 grossly intact, sensation is normal throughout Psych: he is alert, and answers some questions, but not many.  He does not seem agitated currently.   Results for orders placed or performed during the hospital encounter of 04/10/2020 (from the past 48 hour(s))  CBC with Differential     Status: Abnormal   Collection Time: 04/12/2020 11:00 PM  Result Value Ref Range   WBC 5.9 4.0 - 10.5 K/uL   RBC 3.06 (L) 4.22 - 5.81 MIL/uL   Hemoglobin 9.6 (L) 13.0 - 17.0 g/dL   HCT 28.2 (L) 39 - 52 %   MCV 92.2 80.0 - 100.0 fL   MCH 31.4 26.0 - 34.0 pg   MCHC 34.0 30.0 - 36.0 g/dL   RDW 18.6 (H) 11.5 - 15.5 %   Platelets 150 150 - 400 K/uL   nRBC 0.0 0.0 - 0.2 %   Neutrophils Relative % 84 %   Neutro Abs 5.0 1.7 - 7.7 K/uL   Lymphocytes Relative 9 %   Lymphs Abs 0.5 (L) 0.7 - 4.0 K/uL   Monocytes Relative 6 %   Monocytes Absolute 0.3 0 - 1 K/uL   Eosinophils Relative 0 %   Eosinophils Absolute 0.0 0 - 0 K/uL   Basophils Relative 0 %   Basophils Absolute 0.0 0 - 0 K/uL   WBC Morphology VACUOLATED NEUTROPHILS    Immature Granulocytes 1 %   Abs Immature Granulocytes  0.07 0.00 - 0.07 K/uL    Comment: Performed at [facility], [place] [address], [place], [place] [address]  Comprehensive metabolic panel     Status: Abnormal   Collection Time: 04/05/2020 11:00 PM  Result Value Ref Range   Sodium 117 (LL) 135 - 145 mmol/L    Comment: CRITICAL RESULT CALLED TO, READ BACK BY AND VERIFIED WITH: RN B BROOKS AT 2352 03/22/2020 [place] A    Potassium 5.5 (H) 3.5 - 5.1 mmol/L   Chloride 81 (L) 98 - 111 mmol/L   CO2 15 (L) 22 - 32 mmol/L   Glucose, Bld 61 (L) 70 - 99 mg/dL    Comment: Glucose reference range applies only to samples taken after fasting for at least 8 hours.   BUN 41 (H) 8 - 23  mg/dL   Creatinine, Ser 1.56 (H) 0.61 - 1.24 mg/dL   Calcium 6.7 (L) 8.9 - 10.3 mg/dL   Total Protein 4.1 (L) 6.5 - 8.1 g/dL   Albumin 1.8 (L) 3.5 - 5.0 g/dL   AST 465 (H) 15 - 41 U/L   ALT <5 0 - 44 U/L   Alkaline Phosphatase 509 (H) 38 - 126 U/L   Total Bilirubin 12.1 (H) 0.3 - 1.2 mg/dL   GFR calc non Af Amer 47 (L) >60 mL/min   GFR calc Af Amer 54 (L) >60 mL/min   Anion gap 21 (H) 5 - 15    Comment: Performed at [facility], [place] [address] [place], [place] [address]  Ammonia     Status: Abnormal   Collection Time: 04/03/2020 11:00 PM  Result Value Ref Range   Ammonia 45 (H) 9 - 35 umol/L    Comment: Performed at [facility], [place] [address], [place], [place] [address]  Lactic acid, plasma     Status: Abnormal   Collection Time: 04/18/2020 11:00 PM  Result Value Ref Range   Lactic Acid, Venous 9.1 (HH) 0.5 - 1.9 mmol/L    Comment: CRITICAL RESULT CALLED TO, READ BACK BY AND VERIFIED WITH: RN B BROOKS AT 2352 04/18/2020 [place]ANK A Performed at [facility], [place] [address], [place], [place] [address]   Protime-INR     Status: Abnormal   Collection Time: 04/04/2020 11:00 PM  Result Value Ref Range   Prothrombin Time 30.6 (H) 11.4 - 15.2 seconds   INR 3.1 (H) 0.8 - 1.2    Comment: (NOTE) INR  goal varies based on device and disease states. Performed at [facility], [place] [address] [place], [place] [address]   Ethanol     Status: None   Collection Time: 03/23/2020 11:00 PM  Result Value Ref Range   Alcohol, Ethyl (B) <10 <10 mg/dL    Comment: (NOTE) Lowest detectable limit for serum alcohol is 10 mg/dL.  For medical purposes only. Performed at [facility], [place] [address], [place], [place] [address]   Lactic acid, plasma     Status: Abnormal   Collection Time: 03/25/20  2:14 AM  Result Value Ref Range   Lactic Acid, Venous >11.0 (HH) 0.5 - 1.9 mmol/L    Comment: CRITICAL VALUE NOTED.  VALUE IS CONSISTENT WITH PREVIOUSLY REPORTED AND CALLED VALUE. Performed at [facility], [place] [address] [place],  [address]   Comprehensive metabolic panel     Status: Abnormal   Collection Time: 03/25/20  2:14 AM  Result Value Ref Range   Sodium 115 (LL) 135 - 145 mmol/L    Comment: CRITICAL RESULT CALLED TO, READ BACK BY AND VERIFIED WITH: RN B BROOKS AT 0250 03/25/20 CRU[place]ANK A    Potassium 5.6 (H) 3.5 - 5.1 mmol/L   Chloride 84 (L) 98 - 111 mmol/L   CO2 15 (L) 22 - 32 mmol/L   Glucose, Bld 96 70 - 99 mg/dL    Comment: Glucose reference range applies only to samples taken after fasting for at least 8 hours.   BUN 45 (H) 8 - 23 mg/dL   Creatinine, Ser 1.86 (H) 0.61 - 1.24 mg/dL   Calcium 5.8 (LL) 8.9 - 10.3 mg/dL    Comment: CRITICAL RESULT CALLED TO, READ BACK BY AND VERIFIED WITH: RN B BROOKS AT 0250 03/25/20 CRU[place]ANK A    Total Protein 3.1 (L) 6.5 -  8.1 g/dL   Albumin 1.4 (L) 3.5 - 5.0 g/dL   AST 458 (H) 15 - 41 U/L   ALT <5 0 - 44 U/L   Alkaline Phosphatase 381 (H) 38 - 126 U/L   Total Bilirubin 9.7 (H) 0.3 - 1.2 mg/dL   GFR calc non Af Amer 38 (L) >60 mL/min   GFR calc Af Amer 44 (L) >60 mL/min   Anion gap 16 (H) 5 - 15    Comment: Performed at [facility], [place] [address],  [place], [place] [address]  Acetaminophen level     Status: Abnormal   Collection Time: 03/25/20  2:18 AM  Result Value Ref Range   Acetaminophen (Tylenol), Serum <10 (L) 10 - 30 ug/mL    Comment: (NOTE) Therapeutic concentrations vary significantly. A range of 10-30 ug/mL  may be an effective concentration for many patients. However, some  are best treated at concentrations outside of this range. Acetaminophen concentrations >150 ug/mL at 4 hours after ingestion  and >50 ug/mL at 12 hours after ingestion are often associated with  toxic reactions.  Performed at [facility], [place] [address], [place], [place] [address]   Hepatitis panel, acute     Status: None   Collection Time: 03/25/20  2:18 AM  Result Value Ref Range   Hepatitis B Surface Ag NON REACTIVE NON REACTIVE   HCV Ab NON REACTIVE NON REACTIVE    Comment: (NOTE) Nonreactive HCV antibody screen is consistent with no HCV infections,  unless recent infection is suspected or other evidence exists to indicate HCV infection.     Hep A IgM NON REACTIVE NON REACTIVE   Hep B C IgM NON REACTIVE NON REACTIVE    Comment: Performed at [facility], [place] [address], [place], [place] [address]  Osmolality     Status: Abnormal   Collection Time: 03/25/20  3:30 AM  Result Value Ref Range   Osmolality 267 (L) 275 - 295 mOsm/kg    Comment: Performed at [place] [address], [place], [place] [address]  HIV Antibody (routine testing w rflx)     Status: None   Collection Time: 03/25/20  3:30 AM  Result Value Ref Range   HIV Screen 4th Generation wRfx Non Reactive Non Reactive    Comment: Performed at [facility], [place] [address] [place],  [address]  CBG monitoring, ED     Status: Abnormal   Collection Time: 03/25/20  4:58 AM  Result Value Ref Range   Glucose-Capillary 133 (H) 70 - 99 mg/dL    Comment: Glucose reference range applies only to samples taken after fasting for at least 8 hours.   Urinalysis, Routine w reflex microscopic Urine, Catheterized     Status: Abnormal   Collection Time: 03/25/20  5:06 AM  Result Value Ref Range   Color, Urine AMBER (A) YELLOW    Comment: BIOCHEMICALS MAY BE AFFECTED BY COLOR   APPearance CLEAR CLEAR   Specific Gravity, Urine 1.023 1.005 - 1.030   pH 5.0 5.0 - 8.0   Glucose, UA 50 (A) NEGATIVE mg/dL   Hgb urine dipstick MODERATE (A) NEGATIVE   Bilirubin Urine MODERATE (A) NEGATIVE   Ketones, ur NEGATIVE NEGATIVE mg/dL   Protein, ur NEGATIVE NEGATIVE mg/dL   Nitrite NEGATIVE NEGATIVE   Leukocytes,Ua NEGATIVE NEGATIVE   RBC / HPF 0-5 0 - 5 RBC/hpf   WBC, UA 11-20 0 - 5 WBC/hpf   Bacteria, UA  FEW (A) NONE SEEN   Squamous Epithelial / LPF 0-5 0 - 5   Mucus PRESENT    Hyaline Casts, UA PRESENT     Comment: Performed at [facility], [place] [address] [place], [place] [address]  Rapid urine drug screen (hospital performed)     Status: None   Collection Time: 03/25/20  5:06 AM  Result Value Ref Range   Opiates NONE DETECTED NONE DETECTED   Cocaine NONE DETECTED NONE DETECTED   Benzodiazepines NONE DETECTED NONE DETECTED   Amphetamines NONE DETECTED NONE DETECTED   Tetrahydrocannabinol NONE DETECTED NONE DETECTED   Barbiturates NONE DETECTED NONE DETECTED    Comment: (NOTE) DRUG SCREEN FOR MEDICAL PURPOSES ONLY.  IF CONFIRMATION IS NEEDED FOR ANY PURPOSE, NOTIFY LAB WITHIN 5 DAYS.  LOWEST DETECTABLE LIMITS FOR URINE DRUG SCREEN Drug Class                     Cutoff (ng/mL) Amphetamine and metabolites    1000 Barbiturate and metabolites    200 Benzodiazepine                 734 Tricyclics and metabolites     300 Opiates and metabolites        300 Cocaine and metabolites        300 THC                            50 Performed at [facility], [place] [address] [place], [place] [address]   Osmolality, urine     Status: None   Collection Time: 03/25/20  5:06 AM  Result Value Ref Range    Osmolality, Ur 359 300 - 900 mOsm/kg    Comment: Performed at [place] [address], [place], [place] [address]  Sodium, urine, random     Status: None   Collection Time: 03/25/20  5:06 AM  Result Value Ref Range   Sodium, Ur <10 mmol/L    Comment: Performed at [facility], [place] [address], [place], [place] [address]  Lipase, blood     Status: Abnormal   Collection Time: 03/25/20  5:06 AM  Result Value Ref Range   Lipase 55 (H) 11 - 51 U/L    Comment: Performed at [facility], [place] [address], [place], [place] [address]  Gamma GT     Status: Abnormal   Collection Time: 03/25/20  5:06 AM  Result Value Ref Range   GGT 304 (H) 7 - 50 U/L    Comment: Performed at [facility], [place] [address], [place], [place] [address]  Hemoglobin A1c     Status: None   Collection Time: 03/25/20  5:06 AM  Result Value Ref Range   Hgb A1c MFr Bld 5.3 4.8 - 5.6 %    Comment: (NOTE) Pre diabetes:          5.7%-6.4%  Diabetes:              >6.4%  Glycemic control for   <7.0% adults with diabetes    Mean Plasma Glucose 105.41 mg/dL    Comment: Performed at [place] [address], [place], [place] [address]  Magnesium     Status: Abnormal   Collection Time: 03/25/20  5:06 AM  Result Value Ref Range   Magnesium 2.8 (H) 1.7 - 2.4 mg/dL    Comment: Performed at  [facility], [place] [address], [place], [place] [address]  Phosphorus     Status: None   Collection Time: 03/25/20  5:06 AM  Result Value Ref Range   Phosphorus 4.6 2.5 - 4.6 mg/dL    Comment: Performed at [facility], [place] [address], [place], [place] [address]  Lactic acid, plasma     Status: Abnormal   Collection Time: 03/25/20  5:06 AM  Result Value Ref Range   Lactic Acid, Venous 7.0 (HH) 0.5 - 1.9 mmol/L    Comment: CRITICAL VALUE NOTED.  VALUE IS CONSISTENT WITH PREVIOUSLY REPORTED AND CALLED VALUE. Performed at [facility], [place] [address] [place], [place] [address]   Protime-INR     Status: Abnormal   Collection Time: 03/25/20  5:06 AM  Result Value Ref Range   Prothrombin Time 39.7 (H) 11.4 - 15.2 seconds   INR 4.3 (HH) 0.8 - 1.2    Comment: CRITICAL RESULT CALLED TO, READ BACK BY AND VERIFIED WITH: ZULETA, K. RN _0  ON 8.5.2021 BY NMCCOY (NOTE) INR goal varies based on device and disease states. Performed at [facility], [place] [address] [place], [place] [address]   CBC     Status: Abnormal   Collection Time: 03/25/20  5:06 AM  Result Value Ref Range   WBC 3.0 (L) 4.0 - 10.5 K/uL   RBC 2.05 (L) 4.22 - 5.81 MIL/uL   Hemoglobin 6.5 (LL) 13.0 - 17.0 g/dL    Comment: REPEATED TO VERIFY THIS CRITICAL RESULT HAS VERIFIED AND BEEN CALLED TO [place],S BY KENNEDY JACKSON ON 08 05 2021 AT [place], AND HAS BEEN READ BACK. CRITICAL RESULT VERIFIED    HCT 19.1 (L) 39 - 52 %   MCV 93.2 80.0 - 100.0 fL   MCH 31.7 26.0 - 34.0 pg   MCHC 34.0 30.0 - 36.0 g/dL   RDW 18.9 (H) 11.5 - 15.5 %   Platelets 73 (L) 150 - 400 K/uL    Comment: DELTA CHECK NOTED PLATELET COUNT CONFIRMED BY SMEAR Immature Platelet Fraction may be clinically indicated, consider ordering this additional test [id number]    nRBC 0.0 0.0 - 0.2 %    Comment: Performed at [facility], [place] [address], [place], [place] [address]  Hepatic function panel     Status: Abnormal   Collection Time: 03/25/20  5:06 AM  Result Value Ref Range   Total Protein 3.1 (L) 6.5 - 8.1 g/dL   Albumin 1.4 (L) 3.5 - 5.0 g/dL   AST 537 (H) 15 - 41 U/L   ALT 107 (H) 0 - 44 U/L   Alkaline Phosphatase 347 (H) 38 - 126 U/L   Total Bilirubin 9.8 (H) 0.3 - 1.2 mg/dL   Bilirubin, Direct 6.3 (H) 0.0 - 0.2 mg/dL   Indirect Bilirubin 3.5 (H) 0.3 - 0.9 mg/dL    Comment: Performed at [facility], [place] [address] [place], [place] [address]  Na and K (sodium & potassium), rand urine     Status:  None   Collection Time: 03/25/20  5:06 AM  Result Value Ref Range   Sodium, Ur <10 mmol/L   Potassium Urine 39 mmol/L    Comment: Performed at [facility], [place] [address] [place],  [address]  Volatiles,Blood (acetone,ethanol,isoprop,methanol)     Status: None   Collection Time: 03/25/20  5:06 AM  Result Value Ref Range   Acetone, blood <0.010 0.000 - 0.010 g/dL  Comment: (NOTE) **STAT VOLATILES RESULT CALLED TO NICOLE BY MELANIE ON  03/25/20 AT 10:19 A.M.                                Detection Limit = 0.010 Performed At: [facility] [place], [place] [id number] [place] Farmer MD EU:[id number]    Ethanol, blood <0.010 0.000 - 0.010 g/dL    Comment: (NOTE)                                Detection Limit = 0.010 This test was developed and its performance characteristics determined by [facility]. It has not been cleared or approved by the Food and Drug Administration.    Isopropanol, blood <0.010 0.000 - 0.010 g/dL    Comment:                                 Detection Limit = 0.010   Methanol, blood <0.010 0.000 - 0.010 g/dL    Comment:                                 Detection Limit = 0.010  CK     Status: None   Collection Time: 03/25/20  5:06 AM  Result Value Ref Range   Total CK 336 49.0 - 397.0 U/L    Comment: Performed at [facility] [place] [address] [place], [place] [address]  SARS Coronavirus 2 by RT PCR (hospital order, performed in [facility] hospital lab) Nasopharyngeal Urine, Catheterized     Status: None   Collection Time: 03/25/20  6:16 AM   Specimen: Urine, Catheterized; Nasopharyngeal  Result Value Ref Range   SARS Coronavirus 2 NEGATIVE NEGATIVE    Comment: (NOTE) SARS-CoV-2 target nucleic acids are NOT DETECTED.  The SARS-CoV-2 RNA is generally detectable in upper and lower respiratory specimens during the acute phase of infection. The lowest concentration of SARS-CoV-2 viral copies this  assay can detect is 250 copies / mL. A negative result does not preclude SARS-CoV-2 infection and should not be used as the sole basis for treatment or other patient management decisions.  A negative result may occur with improper specimen collection / handling, submission of specimen other than nasopharyngeal swab, presence of viral mutation(s) within the areas targeted by this assay, and inadequate number of viral copies (<250 copies / mL). A negative result must be combined with clinical observations, patient history, and epidemiological information.  Fact Sheet for Patients:   StrictlyIdeas.no  Fact Sheet for Healthcare Providers: BankingDealers.co.za  This test is not yet approved or  cleared by the [place] FDA and has been authorized for detection and/or diagnosis of SARS-CoV-2 by FDA under an Emergency Use Authorization (EUA).  This EUA will remain in effect (meaning this test can be used) for the duration of the COVID-19 declaration under Section 564(b)(1) of the Act, 21 U.S.C. section 360bbb-3(b)(1), unless the authorization is terminated or revoked sooner.  Performed at [facility], [place] [address], [place], [place] [address]   Basic metabolic panel     Status: Abnormal   Collection Time: 03/25/20  6:19 AM  Result Value Ref Range   Sodium 114 (LL) 135 - 145 mmol/L    Comment: CRITICAL  RESULT CALLED TO, READ BACK BY AND VERIFIED WITH: SMITH,T. RN AT 3875 03/25/20 MULLINS,T    Potassium 4.6 3.5 - 5.1 mmol/L    Comment: DELTA CHECK NOTED   Chloride 85 (L) 98 - 111 mmol/L   CO2 16 (L) 22 - 32 mmol/L   Glucose, Bld 227 (H) 70 - 99 mg/dL    Comment: Glucose reference range applies only to samples taken after fasting for at least 8 hours.   BUN 39 (H) 8 - 23 mg/dL   Creatinine, Ser 1.67 (H) 0.61 - 1.24 mg/dL   Calcium 5.5 (LL) 8.9 - 10.3 mg/dL    Comment: CRITICAL RESULT CALLED TO, READ BACK BY AND  VERIFIED WITH: SMITH,T. RN AT 6433 03/25/20 MULLINS,T    GFR calc non Af Amer 43 (L) >60 mL/min   GFR calc Af Amer 50 (L) >60 mL/min   Anion gap 13 5 - 15    Comment: Performed at [facility], [place] [address], [place], [place] [address]  Iron and TIBC     Status: None   Collection Time: 03/25/20  6:19 AM  Result Value Ref Range   Iron 91 45 - 182 ug/dL   TIBC NOT CALCULATED 250 - 450 ug/dL   Saturation Ratios NOT CALCULATED 17.9 - 39.5 %   UIBC NOT CALCULATED ug/dL    Comment: Performed at [facility], [place] [address], [place], [place] [address]  Ferritin     Status: Abnormal   Collection Time: 03/25/20  6:19 AM  Result Value Ref Range   Ferritin 1,225 (H) 24 - 336 ng/mL    Comment: Performed at [facility], [place] [address] [place], [place] [address]  Cortisol-am, blood     Status: Abnormal   Collection Time: 03/25/20  6:19 AM  Result Value Ref Range   Cortisol - AM 31.1 (H) 6.7 - 22.6 ug/dL    Comment: Performed at [place] [address], [place], [place] [address]  TSH     Status: None   Collection Time: 03/25/20  6:19 AM  Result Value Ref Range   TSH 1.531 0.350 - 4.500 uIU/mL    Comment: Performed by a 3rd Generation assay with a functional sensitivity of <=0.01 uIU/mL. Performed at [facility], [place] [address] [place], [place] [address]   Type and screen [place]     Status: None (Preliminary result)   Collection Time: 03/25/20  6:19 AM  Result Value Ref Range   ABO/RH(D) A POS    Antibody Screen NEG    Sample Expiration 03/28/2020,2359    Unit Number [id number]    Blood Component Type RED CELLS,LR    Unit division 00    Status of Unit ISSUED    Transfusion Status OK TO TRANSFUSE    Crossmatch Result Compatible    Unit Number [id number]    Blood Component Type RED CELLS,LR    Unit division 00    Status of Unit ISSUED    Transfusion Status OK TO  TRANSFUSE    Crossmatch Result      Compatible Performed at [facility], [place] [address], [place], [place] [address]   Prepare RBC (crossmatch)     Status: None   Collection Time: 03/25/20  6:19 AM  Result Value Ref Range   Order Confirmation      ORDER PROCESSED BY BLOOD BANK Performed at [facility], [place] [address] [place],  [place] [address]   ABO/Rh     Status: None   Collection Time: 03/25/20  6:40 AM  Result Value Ref Range   ABO/RH(D)      A POS Performed at [facility], [place] [address], [place], [place] [address]   CBG monitoring, ED     Status: Abnormal   Collection Time: 03/25/20  7:49 AM  Result Value Ref Range   Glucose-Capillary 160 (H) 70 - 99 mg/dL    Comment: Glucose reference range applies only to samples taken after fasting for at least 8 hours.  MRSA PCR Screening     Status: None   Collection Time: 03/25/20  8:53 AM   Specimen: Nasal Mucosa; Nasopharyngeal  Result Value Ref Range   MRSA by PCR NEGATIVE NEGATIVE    Comment:        The GeneXpert MRSA Assay (FDA approved for NASAL specimens only), is one component of a comprehensive MRSA colonization surveillance program. It is not intended to diagnose MRSA infection nor to guide or monitor treatment for MRSA infections. Performed at [facility], [place] [address], [place], [place] [address]    CT Head Wo Contrast  Result Date: 03/25/2020 CLINICAL DATA:  Mental status change EXAM: CT HEAD WITHOUT CONTRAST TECHNIQUE: Contiguous axial images were obtained from the base of the skull through the vertex without intravenous contrast. COMPARISON:  None. FINDINGS: Brain: No acute territorial infarction, hemorrhage, or intracranial mass. Mild atrophy. Mild hypodensity in the white matter consistent with chronic small vessel ischemic change. Nonenlarged ventricles. Vascular: No hyperdense vessels.  Carotid vascular calcification Skull: Normal.  Negative for fracture or focal lesion. Sinuses/Orbits: No acute finding. Other: None IMPRESSION: 1. No CT evidence for acute intracranial abnormality. 2. Atrophy and mild chronic small vessel ischemic changes of the white matter. Electronically Signed   By: Donavan Foil M.D.   On: 03/25/2020 02:56   CT ABDOMEN PELVIS W CONTRAST  Result Date: 03/25/2020 CLINICAL DATA:  Jaundice EXAM: CT ABDOMEN AND PELVIS WITH CONTRAST TECHNIQUE: Multidetector CT imaging of the abdomen and pelvis was performed using the standard protocol following bolus administration of intravenous contrast. CONTRAST:  12m OMNIPAQUE IOHEXOL 300 MG/ML  SOLN COMPARISON:  None. FINDINGS: Lower chest: Lung bases demonstrate small bilateral pleural effusions. Cardiac size within normal limits. Hepatobiliary: No focal hepatic abnormality. Hyperdense sludge or vicarious contrast excretion within the gallbladder. Possible gallbladder wall thickening or edema. Possible contour nodularity of the liver. Pancreas: Unremarkable. No pancreatic ductal dilatation or surrounding inflammatory changes. Spleen: Enlarged up to 15 cm. Adrenals/Urinary Tract: Adrenal glands are normal. Kidneys show no hydronephrosis. The urinary bladder is unremarkable. Stomach/Bowel: Stomach nonenlarged. Thickened slightly dilated appearing jejunal small bowel loops in the left upper quadrant. Generalized fluid-filled small bowel without well-defined transition. Mild wall thickening of the hepatic flexure and ascending colon. Negative appendix. Vascular/Lymphatic: Moderate aortic atherosclerosis. No aneurysm. No suspicious nodes Reproductive: Prostate is unremarkable.  Prostate calcifications. Other: No free air. Moderate volume of ascites within the abdomen and pelvis Musculoskeletal: No acute or suspicious osseous abnormality. IMPRESSION: 1. Small right greater than left pleural effusions. 2. Suspected liver disease. There is splenomegaly and moderate volume of ascites within the  abdomen and pelvis. 3. Diffuse fluid within the small bowel. Mild thickened appearance of left upper quadrant jejunal small bowel loops with mild wall thickening of the ascending colon and hepatic flexure. Findings could be secondary to enteritis/colitis versus portal enteropathy. 4. Hyperdense sludge within the gallbladder. Possible gallbladder wall thickening or edema,  correlation with right upper quadrant ultrasound could be obtained as indicated Aortic Atherosclerosis (ICD10-I70.0). Electronically Signed   By: Donavan Foil M.D.   On: 03/25/2020 03:35   DG Chest Portable 1 View  Result Date: 03/25/2020 CLINICAL DATA:  Sepsis EXAM: PORTABLE CHEST 1 VIEW COMPARISON:  11/16/2015 FINDINGS: The heart size and mediastinal contours are within normal limits. Both lungs are clear. The visualized skeletal structures are unremarkable. Calcification of the aorta. IMPRESSION: No active disease. Electronically Signed   By: Lucienne Capers M.D.   On: 03/25/2020 01:07   US Abdomen Limited RUQ  Result Date: 03/25/2020 CLINICAL DATA:  Elevated liver function studies.  Severe jaundice. EXAM: ULTRASOUND ABDOMEN LIMITED RIGHT UPPER QUADRANT COMPARISON:  CT 03/25/2020 FINDINGS: Gallbladder: Gallbladder is filled with sludge with thickened wall and pericholecystic edema. Murphy's sign is positive. No discrete stones are identified. Common bile duct: Diameter: 5 mm, normal Liver: Heterogeneous liver parenchymal echotexture suggesting cirrhosis or fatty infiltration. No focal lesions identified. Portal vein is patent on color Doppler imaging with normal direction of blood flow towards the liver. Other: Upper abdominal ascites is present. IMPRESSION: 1. Gallbladder is filled with sludge, thickened wall, and pericholecystic edema. Positive Murphy's sign. Changes may represent acute cholecystitis in the appropriate clinical setting. 2. Heterogeneous liver parenchymal echotexture suggesting cirrhosis or fatty infiltration. 3. Upper  abdominal ascites. Electronically Signed   By: Lucienne Capers M.D.   On: 03/25/2020 05:49   . sodium chloride    . sodium chloride    . ceFEPime (MAXIPIME) IV    . metronidazole Stopped (03/25/20 0729)  . norepinephrine (LEVOPHED) Adult infusion 4 mcg/min (03/25/20 1204)  . phytonadione (VITAMIN K) IV Stopped (03/25/20 4742)  . sodium bicarbonate 150 mEq in dextrose 5% 1000 mL Stopped (03/25/20 1155)      Assessment/Plan Acute on chronic liver disease  - T bil 9.0, AST 537, ALT 107 Hypercoagulable INR 4.3 Severe hyponatremia AKI  - creatinine 5.95>>6.38 Acute metabolic encephalopathy - sepsis/hepatic Anemia  -  H/H 6.5/19.1 - being transfused Thrombocytopenia  - platelets 150K >> 73K Hx alcohol abuse  Septic shock with severe acidosis  - lactate >11 >> 7.0 Possible acute cholecystitis versus SBP/colitis  FEN: N.p.o./IV fluids ID: Maxipime/flagyl 8/5 >>day 1 DVT: INR 4.3 - getting FFP now/Vitamin K Follow-up: TBD   Plan:  Agree with current Rx, antibiotics, fluid resuscitation, reverse his INR, treat metabolic issues. On my exam he very clearly stated he had no abdominal pain, even in the RUQ.  I would continue current treatment and reassess in the AM.  If we were to do anything today it would be IR drain, but he needs to have INR corrected to do that.  We will follow with you.  I would also ask GI to see.    Earnstine Regal [facility] Surgery 03/25/2020, 11:36 AM Please see Amion for pager number during day hours 7:00am-4:30pm

## 2020-03-26 ENCOUNTER — Inpatient Hospital Stay (HOSPITAL_COMMUNITY): Payer: BLUE CROSS/BLUE SHIELD

## 2020-03-26 DIAGNOSIS — D689 Coagulation defect, unspecified: Secondary | ICD-10-CM

## 2020-03-26 DIAGNOSIS — K7290 Hepatic failure, unspecified without coma: Secondary | ICD-10-CM

## 2020-03-26 DIAGNOSIS — R7989 Other specified abnormal findings of blood chemistry: Secondary | ICD-10-CM

## 2020-03-26 LAB — CBC WITH DIFFERENTIAL/PLATELET
Abs Immature Granulocytes: 0.01 10*3/uL (ref 0.00–0.07)
Basophils Absolute: 0 10*3/uL (ref 0.0–0.1)
Basophils Relative: 0 %
Eosinophils Absolute: 0 10*3/uL (ref 0.0–0.5)
Eosinophils Relative: 0 %
HCT: 19.4 % — ABNORMAL LOW (ref 39.0–52.0)
Hemoglobin: 6.8 g/dL — CL (ref 13.0–17.0)
Immature Granulocytes: 1 %
Lymphocytes Relative: 4 %
Lymphs Abs: 0.1 10*3/uL — ABNORMAL LOW (ref 0.7–4.0)
MCH: 30.6 pg (ref 26.0–34.0)
MCHC: 35.1 g/dL (ref 30.0–36.0)
MCV: 87.4 fL (ref 80.0–100.0)
Monocytes Absolute: 0.1 10*3/uL (ref 0.1–1.0)
Monocytes Relative: 10 %
Neutro Abs: 1 10*3/uL — ABNORMAL LOW (ref 1.7–7.7)
Neutrophils Relative %: 85 %
Platelets: 38 10*3/uL — ABNORMAL LOW (ref 150–400)
RBC: 2.22 MIL/uL — ABNORMAL LOW (ref 4.22–5.81)
RDW: 17.8 % — ABNORMAL HIGH (ref 11.5–15.5)
WBC: 1.2 10*3/uL — CL (ref 4.0–10.5)
nRBC: 0 % (ref 0.0–0.2)

## 2020-03-26 LAB — GLUCOSE, CAPILLARY
Glucose-Capillary: 109 mg/dL — ABNORMAL HIGH (ref 70–99)
Glucose-Capillary: 116 mg/dL — ABNORMAL HIGH (ref 70–99)
Glucose-Capillary: 117 mg/dL — ABNORMAL HIGH (ref 70–99)
Glucose-Capillary: 137 mg/dL — ABNORMAL HIGH (ref 70–99)
Glucose-Capillary: 67 mg/dL — ABNORMAL LOW (ref 70–99)
Glucose-Capillary: 70 mg/dL (ref 70–99)
Glucose-Capillary: 91 mg/dL (ref 70–99)

## 2020-03-26 LAB — MAGNESIUM: Magnesium: 2.9 mg/dL — ABNORMAL HIGH (ref 1.7–2.4)

## 2020-03-26 LAB — COMPREHENSIVE METABOLIC PANEL
ALT: 106 U/L — ABNORMAL HIGH (ref 0–44)
ALT: 120 U/L — ABNORMAL HIGH (ref 0–44)
AST: 542 U/L — ABNORMAL HIGH (ref 15–41)
AST: 609 U/L — ABNORMAL HIGH (ref 15–41)
Albumin: 2 g/dL — ABNORMAL LOW (ref 3.5–5.0)
Albumin: 2 g/dL — ABNORMAL LOW (ref 3.5–5.0)
Alkaline Phosphatase: 232 U/L — ABNORMAL HIGH (ref 38–126)
Alkaline Phosphatase: 248 U/L — ABNORMAL HIGH (ref 38–126)
Anion gap: 10 (ref 5–15)
Anion gap: 10 (ref 5–15)
BUN: 42 mg/dL — ABNORMAL HIGH (ref 8–23)
BUN: 42 mg/dL — ABNORMAL HIGH (ref 8–23)
CO2: 26 mmol/L (ref 22–32)
CO2: 27 mmol/L (ref 22–32)
Calcium: 6 mg/dL — CL (ref 8.9–10.3)
Calcium: 5.9 mg/dL — CL (ref 8.9–10.3)
Chloride: 91 mmol/L — ABNORMAL LOW (ref 98–111)
Chloride: 91 mmol/L — ABNORMAL LOW (ref 98–111)
Creatinine, Ser: 1.28 mg/dL — ABNORMAL HIGH (ref 0.61–1.24)
Creatinine, Ser: 1.26 mg/dL — ABNORMAL HIGH (ref 0.61–1.24)
GFR calc Af Amer: >60 mL/min (ref >60)
GFR calc Af Amer: >60 mL/min (ref >60)
GFR calc non Af Amer: 59 mL/min — ABNORMAL LOW (ref >60)
GFR calc non Af Amer: >60 mL/min (ref >60)
Glucose, Bld: 156 mg/dL — ABNORMAL HIGH (ref 70–99)
Glucose, Bld: 82 mg/dL (ref 70–99)
Potassium: 3.4 mmol/L — ABNORMAL LOW (ref 3.5–5.1)
Potassium: 3.5 mmol/L (ref 3.5–5.1)
Sodium: 127 mmol/L — ABNORMAL LOW (ref 135–145)
Sodium: 128 mmol/L — ABNORMAL LOW (ref 135–145)
Total Bilirubin: 11 mg/dL — ABNORMAL HIGH (ref 0.3–1.2)
Total Bilirubin: 13.3 mg/dL — ABNORMAL HIGH (ref 0.3–1.2)
Total Protein: 3.5 g/dL — ABNORMAL LOW (ref 6.5–8.1)
Total Protein: 3.6 g/dL — ABNORMAL LOW (ref 6.5–8.1)

## 2020-03-26 LAB — PROTEIN, PLEURAL OR PERITONEAL FLUID: Total protein, fluid: <3 g/dL

## 2020-03-26 LAB — PHOSPHORUS: Phosphorus: 2.4 mg/dL — ABNORMAL LOW (ref 2.5–4.6)

## 2020-03-26 LAB — URINE CULTURE: Culture: NO GROWTH

## 2020-03-26 LAB — PATHOLOGIST SMEAR REVIEW

## 2020-03-26 LAB — PROTIME-INR
INR: 1.9 — ABNORMAL HIGH (ref 0.8–1.2)
Prothrombin Time: 21.2 seconds — ABNORMAL HIGH (ref 11.4–15.2)

## 2020-03-26 LAB — LACTIC ACID, PLASMA: Lactic Acid, Venous: 3.1 mmol/L (ref 0.5–1.9)

## 2020-03-26 LAB — PREPARE RBC (CROSSMATCH)

## 2020-03-26 MED ORDER — SODIUM CHLORIDE 0.9% IV SOLUTION: Freq: Once | INTRAVENOUS | Status: AC

## 2020-03-26 MED ORDER — VITAMIN K1 10 MG/ML IJ SOLN
10.0000 mg | Freq: Once | INTRAVENOUS | Status: AC
  Administered 2020-03-26: 10 mg via INTRAVENOUS
  Filled 2020-03-26: qty 1

## 2020-03-26 MED ORDER — NOREPINEPHRINE 4 MG/250ML-% IV SOLN
0.0000 ug/min | INTRAVENOUS | Status: DC
  Administered 2020-03-26: 4 ug/min via INTRAVENOUS
  Administered 2020-03-27: 5 ug/min via INTRAVENOUS
  Administered 2020-03-28: 23 ug/min via INTRAVENOUS
  Administered 2020-03-28: 2 ug/min via INTRAVENOUS
  Administered 2020-03-28: 20 ug/min via INTRAVENOUS
  Administered 2020-03-29 (×2): 40 ug/min via INTRAVENOUS
  Administered 2020-03-29: 30 ug/min via INTRAVENOUS
  Administered 2020-03-29: 40 ug/min via INTRAVENOUS
  Filled 2020-03-26 (×7): qty 250

## 2020-03-26 MED ORDER — LACTATED RINGERS IV SOLN: INTRAVENOUS | Status: DC

## 2020-03-26 MED ORDER — DEXTROSE IN LACTATED RINGERS 5 % IV SOLN: INTRAVENOUS | Status: AC

## 2020-03-26 MED ORDER — TECHNETIUM TC 99M MEBROFENIN IV KIT
5.3000 | PACK | Freq: Once | INTRAVENOUS | Status: AC | PRN
  Administered 2020-03-26: 5.3 via INTRAVENOUS

## 2020-03-26 NOTE — Progress Notes (Signed)
CRITICAL VALUE ALERT  Critical Value:  WBC 1.2, hgb 6.8  Date & Time Notied:  03/26/20 0845  Provider Notified: Kreg Shropshire, NP PCCM   Orders Received/Actions taken: none at this time.

## 2020-03-26 NOTE — Progress Notes (Signed)
Initial Nutrition Assessment  DOCUMENTATION CODES:   Not applicable  INTERVENTION:  - diet advancement as medically feasible. - will complete NFPE when possible.   NUTRITION DIAGNOSIS:   Inadequate oral intake related to inability to eat as evidenced by NPO status.  GOAL:   Patient will meet greater than or equal to 90% of their needs  MONITOR:   Diet advancement, Labs, Weight trends  REASON FOR ASSESSMENT:   Malnutrition Screening Tool  ASSESSMENT:   64 y.o. male with medical history of alcohol abuse who was admitted from the ED after being found hypotensive and altered at home. Initial BP was 50s/30s, which subsequently responded well to volume resuscitation. He was found to be febrile with fever of 101.5 F. For the 1 week PTA he had told family that he did not feel well, was having dark loose stools, nausea, and poor PO intake.  Patient has been NPO since admission. He is noted to be a/o to self and place only. CCM in patient's room for procedure at this time (repeat paracentesis).   MST report notes that patient's sister reported that patient lost 70 lb in the past 6 months. Weight on admission of 8/4 documented as 175 lb (which appears to be a stated weight), weight yesterday documented as 141 lb, and weight today documented as 152 lb.  Patient had paracentesis yesterday with 7 ml f yellow fluid removed.   Per notes: - circulatory shock with severe lactic acidosis - plan for HIDA scan - acute on chronic liver disease with acute alcoholic hepatitis and acute liver failure - AKI - hypovolemic hyponatremia - acute metabolic encephalopathy - thrombocytopenia   Labs reviewed; CBGs: 137, 117, 109 mg/dl; Na: 127 mmol/l; K: 3.4 mmol/l; Cl: 91 mmol/l; BUN: 42 mg/dl; creatinine: 6.30 mg/dl; Ca: 6 mg/dl; Phos: 2.4 mg/dl; Mg: 2.9 mg/dl.  Medications reviewed; 100 mg colace BID, 1 mg folvite/day, sliding scale novolog, 40 mg IV protonix BID, 10 mg IV vitamin K x1 run 8/6, 5 mg  IV vitamin K x1 run/day (8/5-8/7), 100 mg IV thiamine/day.  IVF; LR @ 75 ml/hr.     NUTRITION - FOCUSED PHYSICAL EXAM:  unable to complete at this time.   Diet Order:   Diet Order            Diet NPO time specified Except for: Sips with Meds  Diet effective now                 EDUCATION NEEDS:   No education needs have been identified at this time  Skin:  Skin Assessment: Reviewed RN Assessment  Last BM:  8/5  Height:   Ht Readings from Last 1 Encounters:  03/25/20 5\' 9"  (1.753 m)    Weight:   Wt Readings from Last 1 Encounters:  03/26/20 68.8 kg    Estimated Nutritional Needs:  Kcal:  2150-2370 kcal Protein:  110-120 grams Fluid:  >/= 1.5 L/day     05/26/20, MS, RD, LDN, CNSC Inpatient Clinical Dietitian RD pager # available in AMION  After hours/weekend pager # available in [facility]

## 2020-03-26 NOTE — Progress Notes (Addendum)
CC: AMS/hypotension  Subjective: Awake and alert this AM.  He knows he is in [place] in the hospital.  When asked why he is here he reports hard headedness.  Abdominal exam is essentially benign.  He denies any pain.  He does appear slightly distended.  Bowel sounds are hypoactive but present.  He remains jaundiced.  Objective: Vital signs in last 24 hours: Temp:  [93.7 F (34.3 C)-97.5 F (36.4 C)] 96.8 F (36 C) (08/06 0630) Pulse Rate:  [67-87] 83 (08/06 0630) Resp:  [17-33] 31 (08/06 0630) BP: (79-113)/(46-73) 85/55 (08/06 0630) SpO2:  [91 %-97 %] 92 % (08/06 0630) Weight:  [63.9 kg-68.8 kg] 68.8 kg (08/06 0500) Last BM Date: 03/26/20 2078 Iv Crystal 1611blood/FFP>> 4819 Iv total 1225 urine Stool x 2 Afebrile, BP still low, tachycardia better Na 127 Glucose 156 Bun 42 Creatinine 1.28  CMP Latest Ref Rng & Units 03/26/2020 03/25/2020 03/25/2020  Total Protein 6.5 - 8.1 g/dL 3.5(L) 3.3(L) -  Total Bilirubin 0.3 - 1.2 mg/dL 11.0(H) 10.5(H) -  Alkaline Phos 38 - 126 U/L 232(H) 300(H) -  AST 15 - 41 U/L 542(H) 643(H) -  ALT 0 - 44 U/L 106(H) 120(H) -  INR 1.9 Lactate 3.1     Intake/Output from previous day: 08/05 0701 - 08/06 0700 In: 4819.2 [I.V.:2668.2; Blood:1611; IV Piggyback:540] Out: 2130 [Urine:1225] Intake/Output this shift: Total I/O In: 2470.4 [I.V.:1679.8; Blood:593.5; IV Piggyback:197.1] Out: 525 [Urine:525]  General appearance: alert, cooperative, no distress and He wants something to drink and is asking for water. Resp: clear to auscultation bilaterally GI: Soft, slightly distended nontender on exam.  No peritonitis.  Lab Results:  Recent Labs    03/25/20 0506 03/25/20 0506 03/25/20 1640 03/25/20 2101  WBC 3.0*  --  2.4*  --   HGB 6.5*  --  8.5*  --   HCT 19.1*  --  24.0*  --   PLT 73*   < > 56* 51*   < > = values in this interval not displayed.    BMET Recent Labs    03/25/20 1700 03/26/20 0414  NA 122* 127*  K 3.7 3.4*  CL  91* 91*  CO2 22 26  GLUCOSE 117* 156*  BUN 44* 42*  CREATININE 1.28* 1.28*  CALCIUM 5.5* 6.0*   PT/INR Recent Labs    03/25/20 2101 03/26/20 0414  LABPROT 22.8* 21.2*  INR 2.1* 1.9*    Recent Labs  Lab 04/11/2020 2300 03/25/20 0214 03/25/20 0506 03/25/20 1700 03/26/20 0414  AST 465* 458* 537* 643* 542*  ALT <5 <5 107* 120* 106*  ALKPHOS 509* 381* 347* 300* 232*  BILITOT 12.1* 9.7* 9.8* 10.5* 11.0*  PROT 4.1* 3.1* 3.1* 3.3* 3.5*  ALBUMIN 1.8* 1.4* 1.4* 1.7* 2.0*     Lipase     Component Value Date/Time   LIPASE 55 (H) 03/25/2020 0506     Medications: . sodium chloride   Intravenous Once  . sodium chloride   Intravenous Once  . chlorhexidine  15 mL Mouth Rinse BID  . Chlorhexidine Gluconate Cloth  6 each Topical Daily  . folic acid  1 mg Oral Daily  . insulin aspart  0-15 Units Subcutaneous Q4H  . [START ON 03/27/2020] LORazepam  0-4 mg Oral Q12H  . mouth rinse  15 mL Mouth Rinse BID  . mouth rinse  15 mL Mouth Rinse q12n4p  . multivitamin with minerals  1 tablet Oral Daily  . pantoprazole (PROTONIX) IV  40 mg Intravenous Q12H  .  thiamine  100 mg Oral Daily   Or  . thiamine  100 mg Intravenous Daily   . sodium chloride 250 mL (03/25/20 2130)  . sodium chloride 250 mL (03/25/20 2130)  . ceFEPime (MAXIPIME) IV Stopped (03/26/20 0152)  . metronidazole Stopped (03/26/20 0454)  . norepinephrine (LEVOPHED) Adult infusion 2 mcg/min (03/26/20 0500)  . phytonadione (VITAMIN K) IV Stopped (03/25/20 0981)  . sodium bicarbonate 150 mEq in dextrose 5% 1000 mL 125 mL/hr at 03/26/20 0500    Assessment/Plan Acute on chronic liver disease  - T bil 9.0, AST 537, ALT 107 >> T bilirubin 11/AST 542/ALT 106/alk phos 232 Hypercoagulable INR 4.3 Severe hyponatremia Cl 114>>122>>127 AKI  - creatinine 1.91>>4.78>>2.95>>6.21 Acute metabolic encephalopathy - sepsis/hepatic Anemia  -  H/H 6.5/19.1transfuse>>8.5/24>>6.8/19.4 Thrombocytopenia  - platelets 150K >>  73K>>38K Leukopenia  - WBC 3.0>>2.4>>1.2 Hx alcohol abuse  Septic shock with severe acidosis  - lactate >11 >> 7.0>>3.1 Possible acute cholecystitis versus SBP/colitis  FEN: N.p.o./IV fluids ID: Maxipime/flagyl 8/5 >>day 1 DVT: INR 4.3 - getting FFP now/Vitamin K  >>1.9(8/6) Follow-up: TBD  Plan: Patient has a HIDA scan ordered for this AM.  Currently there is no CBC available.  GI/Dr. Ardis Hughs has recommended diagnostic paracentesis to consider SBE.  He is also ordered viral/autoimmune markers to his labs that are pending.  He remains dependent on a low-dose Levophed for blood pressure support.  We will continue to follow with you.  HIDA results:The cystic duct is patent with filling of the gallbladder lumen. 2. Delayed hepatic uptake and limited biliary excretion with no passage of activity into the small bowel, likely related to hepatic dysfunction. No biliary dilatation on recent imaging.   LOS: 1 day    Bronwyn Belasco 03/26/2020 Please see Amion

## 2020-03-26 NOTE — Progress Notes (Signed)
eLink Physician-Brief Progress Note Patient Name: Cory Aguirre DOB: 12/26/1955 MRN: [id number]   Date of Service  03/26/2020  HPI/Events of Note  Patient with persistent oozing from  Her arterial puncture site, Fibrinogen level is 122.  eICU Interventions  One unit of Cryoprecipitate ordered.        [name] 03/26/2020, 5:23 AM

## 2020-03-26 NOTE — Progress Notes (Signed)
eLink Physician-Brief Progress Note Patient Name: Cory Aguirre DOB: Jan 18, 1956 MRN: [id number]   Date of Service  03/26/2020  HPI/Events of Note  Patient with marginal blood sugars (67-70 gm %), K+ 3.4.  eICU Interventions  D 5 % LR started @ 50 ml / hour.        Thomasene Lot Corlette Ciano 03/26/2020, 8:52 PM

## 2020-03-26 NOTE — Progress Notes (Addendum)
NAME:  Cory Aguirre, MRN:  [id number], DOB:  1956/01/21, LOS: 1 ADMISSION DATE:  04/16/2020, CONSULTATION DATE:  03/26/20  REFERRING MD:  Elpidio Anis PA-C CHIEF COMPLAINT:  Sepsis   Brief History   Cory Aguirre is a 64 y.o. male with h/o alcohol abuse who was admitted from the ED after being found hypotensive and altered in his home. Initial BP was 50s/30s, which subsequently responded well to volume resuscitation. He was found to be febrile to 101.5 F. Labs notable for hyponatremia and significantly elevated LFTs and lactic acidosis.    ->prodrome of about 1 week not feeling well; loose dark stools, nausea poor po intake. 70 lb wt loss last 25mo. Syncopal episode on toilet.     Past Medical History  Alcohol abuse  Significant Hospital Events   8/5 admitted. Volume responsive. CVL placed-->started on pressors. Diagnostic paracentesis completed: Admitted w/ sepsis/septic shock working dx source was enterocolitis vs portal enteropathy +/- SBP; IVFs started, cultures sent. abx initiated. fluid was sterile (cell count, G stain and culture all negative for signs of infection) however other than that the fluid test results do not add very much in evaluating for source of the fluid:  SAAG is unable to be calculated accurately because lab reported ascites fluid as <1. The serum albumin at that time was 1.4. Acute hepatitis panel negative. Starting to wonder if this is more acute liver failure in setting of acute alcoholic hepatitis than sepsis.   8/6: HIDA scan completed.  Hemoglobin down to 6.8, transfusing.  CVP 3.  Still volume depleted.  More awake.  Still pressor dependent.  Renal function somewhat improved.  Consults:  PCCM Surg, IR and GI all consulted.   Procedures:  None  Significant Diagnostic Tests:  CT a/p with contrast (8/5):IMPRESSION:1. Small right greater than left pleural effusions.2. Suspected liver disease. There is splenomegaly and moderate volume of ascites within the abdomen  and pelvis.3. Diffuse fluid within the small bowel. Mild thickened appearanceof left upper quadrant jejunal small bowel loops with mild wallthickening of the ascending colon and hepatic flexure. Findingscould be secondary to enteritis/colitis versus portal enteropathy.4. Hyperdense sludge within the gallbladder. Possible gallbladderwall thickening or edema, correlation with right upper quadrant ultrasound could be obtained as indicated RUQ [place] 8/5: 1. Gallbladder is filled with sludge, thickened wall, and pericholecystic edema. Positive Murphy's sign. Changes may represent acute cholecystitis in the appropriate clinical setting. 2. Heterogeneous liver parenchymal echotexture suggesting cirrhosis or fatty infiltration. 3. Upper abdominal ascites. Acute viral (hep ABC) panel 8/5:  Negative. Ana>>> AMA>>> CMV>>> EBV>>>  Micro Data:  Blood cultures (8/5) pending 8/4 stool culture>>> 8/5 UC: neg 8/5 ascites >>>  Antimicrobials:  Vancomycin (8/5) Cefepime, metronidazole (8/5-)   Interim history/subjective:  Wants to drink.  Reports he is very dry, feels like he has been "pushed around"  Objective   Blood pressure (Abnormal) 87/52, pulse 90, temperature (Abnormal) 97 F (36.1 C), resp. rate (Abnormal) 24, height 5\' 9"  (1.753 m), weight 68.8 kg, SpO2 92 %. CVP:  [3 mmHg-6 mmHg] 3 mmHg      Intake/Output Summary (Last 24 hours) at 03/26/2020 1003 Last data filed at 03/26/2020 0900 Gross per 24 hour  Intake 5165.71 ml  Output 1225 ml  Net 3940.71 ml   Filed Weights   03/31/2020 2236 03/25/20 0900 03/26/20 0500  Weight: 79.4 kg 63.9 kg 68.8 kg    Examination:  General: A chronically ill 64 year old male resting in bed he is in no acute distress this  morning but remains pressor dependent HEENT sclera are icteric mucous membranes are dry neck veins flat Pulmonary: Clear to auscultation no accessory use Cardiac: Regular rate and rhythm without murmur rub or gallop Abdomen: Soft not tender  bowel sounds present GU: Icteric appearing urine Extremities: Scattered areas of ecchymosis jaundice no significant edema Neuro: More awake following commands moves all extremities still confused at times  Resolved Hospital Problem list    Syncope: Likely orthostatic versus vasovagal in setting of recent poor PO intake. Assessment & Plan:    Circulatory shock w/ severe lactic acidosis , presumptive dx is:  intra-abdominal source: r/o cholecystitis, +/- colitis (ascites from paracentesis was sterile so seems like this is NOT SBP)-->lack of abd pain on exam raising concern that this all could be 2/2 decompensated liver failure  -cultures negative to date -no abd pain.  -lactic acid not normalized but improved from 8/5 Plan Day 2 vanc/cefepime and flagyl Cont MIVF Transfuse for hgb < 7 Pressors as needed for MAP > 65 Repeat paracentesis today (per GI recs-->will send albumin, total protein, cytology and mycobacterium) Await results of HIDA scan Trend cbc/wbc and fever curve   acute on chronic liver disease w/ acute alcoholic hepatitis and acute liver failure  MELDNa score 36 His discriminant function is 53 so qualifies for 4 week pred course  -LFTs a little better -acute viral hep panel negative.  -Apap neg Plan Trending LFTs F/u ANA, AMA, CMV, EBV Will need to go on 4 weeks of prednisone but need to r/o/ treat infection first    AKI 2/2 volume depletion -->continues to improve Plan Cont IV hydration Renal dose meds Serial chemistry   Fluid and electrolyte imbalance: hypovolemic hyponatremia (has been improving w/ NaCl replacement), hypokalemia Plan Cont every 12 hrs chem Replace K  Dc bicarb Will place on LR at this point  Cont strict I&O   Acute metabolic encephalopathy: sepsis +/- hepatic  Plan Cont supportive care He is on CIWA protocol  Thrombocytopenia in setting of CLD-->PLTs cont to drop Plan Trend cbc Transfuse for plts < 25 or if we confirm active  bleeding OR if needs intervention would transfuse for <50  Coagulopathy in setting of CLD Plan Repeat Vit K today  Am INR INR goal < 1.5  Microcytic anemia w/ progressive anemia.  -received 2 units PRBC 8/5-->hgb went from 6.5 to 8.5-->now down to 6.8; I don't see active bleeding but certainly at risk for GI bleeding  Plan Fecal occult blood Inc PPI to BID Transfuse 1 unit  Trend cbc   Mild hyperglycemia Plan cbgs but no need for ssi for now   Best practice:  Diet: NPO Pain/Anxiety/Delirium protocol (if indicated): N/A VAP protocol (if indicated): N/A DVT prophylaxis: SCD GI prophylaxis: PPI Glucose control: SSI Mobility: Bed rest Code Status: Full Code Family Communication: Neldon Labella Disposition: Admit to ICU  Labs   CBC: Recent Labs  Lab 2020-04-18 2300 03/25/20 0506 03/25/20 1640 03/25/20 2101 03/26/20 0831  WBC 5.9 3.0* 2.4*  --  1.2*  NEUTROABS 5.0  --   --   --  1.0*  HGB 9.6* 6.5* 8.5*  --  6.8*  HCT 28.2* 19.1* 24.0*  --  19.4*  MCV 92.2 93.2 87.9  --  87.4  PLT 150 73* 56* 51* 38*    Basic Metabolic Panel: Recent Labs  Lab 2020/04/18 2300 03/25/20 0214 03/25/20 0506 03/25/20 0619 03/25/20 1700 03/26/20 0414  NA 117* 115*  --  114* 122* 127*  K  5.5* 5.6*  --  4.6 3.7 3.4*  CL 81* 84*  --  85* 91* 91*  CO2 15* 15*  --  16* 22 26  GLUCOSE 61* 96  --  227* 117* 156*  BUN 41* 45*  --  39* 44* 42*  CREATININE 1.56* 1.86*  --  1.67* 1.28* 1.28*  CALCIUM 6.7* 5.8*  --  5.5* 5.5* 6.0*  MG  --   --  2.8*  --   --  2.9*  PHOS  --   --  4.6  --   --  2.4*   GFR: Estimated Creatinine Clearance: 57.5 mL/min (A) (by C-G formula based on SCr of 1.28 mg/dL (H)). Recent Labs  Lab 04/01/2020 2300 03/23/2020 2300 03/25/20 0214 03/25/20 0506 03/25/20 1640 03/25/20 1800 03/26/20 0559 03/26/20 0831  WBC 5.9  --   --  3.0* 2.4*  --   --  1.2*  LATICACIDVEN 9.1*   < > >11.0* 7.0*  --  2.5* 3.1*  --    < > = values in this interval not displayed.      Liver Function Tests: Recent Labs  Lab 03/26/2020 2300 03/25/20 0214 03/25/20 0506 03/25/20 1700 03/26/20 0414  AST 465* 458* 537* 643* 542*  ALT <5 <5 107* 120* 106*  ALKPHOS 509* 381* 347* 300* 232*  BILITOT 12.1* 9.7* 9.8* 10.5* 11.0*  PROT 4.1* 3.1* 3.1* 3.3* 3.5*  ALBUMIN 1.8* 1.4* 1.4* 1.7* 2.0*   Recent Labs  Lab 03/25/20 0506  LIPASE 55*   Recent Labs  Lab March 30, 2020 2300  AMMONIA 45*    ABG No results found for: PHART, PCO2ART, PO2ART, HCO3, TCO2, ACIDBASEDEF, O2SAT   Coagulation Profile: Recent Labs  Lab 04/03/2020 2300 03/25/20 0506 03/25/20 2101 03/26/20 0414  INR 3.1* 4.3* 2.1* 1.9*      CBG: Recent Labs  Lab 03/25/20 1604 03/25/20 2010 03/25/20 2338 03/26/20 0249 03/26/20 0641  GLUCAP 117* 107* 131* 137* 117*     My cct 40 minutes    Simonne Martinet ACNP-BC [facility] Pulmonary/Critical Care Pager # [phone] OR # [phone] if no answer

## 2020-03-26 NOTE — Procedures (Signed)
Paracentesis Procedure Note  AMAAD BYERS  [id number]  1955-10-21  Date:03/26/20  Time:2:52 PM   Provider Performing:Pete E Tanja Port    Procedure: Paracentesis with imaging guidance (21194)  Indication(s) Ascites  Consent Risks of the procedure as well as the alternatives and risks of each were explained to the patient and/or caregiver.  Consent for the procedure was obtained and is signed in the bedside chart  Anesthesia Topical only with 1% lidocaine    Time Out Verified patient identification, verified procedure, site/side was marked, verified correct patient position, special equipment/implants available, medications/allergies/relevant history reviewed, required imaging and test results available.   Sterile Technique Maximal sterile technique including full sterile barrier drape, hand hygiene, sterile gown, sterile gloves, mask, hair covering, sterile ultrasound probe cover (if used).   Procedure Description Ultrasound used to identify appropriate peritoneal anatomy for placement and overlying skin marked.  Area of drainage cleaned and draped in sterile fashion. Lidocaine was used to anesthetize the skin and subcutaneous tissue.  60 cc's of icteric appearing fluid was drained. Catheter then removed and bandaid applied to site.   Complications/Tolerance None; patient tolerated the procedure well.   EBL Minimal   Specimen(s) Peritoneal fluid  Simonne Martinet ACNP-BC [facility]monary/Critical Care Pager # [phone] OR # [phone] if no answer

## 2020-03-26 NOTE — Progress Notes (Signed)
[facility] Progress Note    Since last GI note:  I was asked to reorder the hida that was suggested by general surgery (not correctly ordered).  This morning he is pretty irritable.  He is unhappy about all the questions and testing.  No abd pains.  He is still on pressor   Objective: Vital signs in last 24 hours: Temp:  [93.7 F (34.3 C)-97.5 F (36.4 C)] 96.8 F (36 C) (08/06 0630) Pulse Rate:  [67-87] 83 (08/06 0630) Resp:  [17-33] 31 (08/06 0630) BP: (79-113)/(46-73) 85/55 (08/06 0630) SpO2:  [91 %-97 %] 92 % (08/06 0630) Weight:  [63.9 kg-68.8 kg] 68.8 kg (08/06 0500) Last BM Date: 03/26/20 General: alert and oriented times 3 Heart: regular rate and rythm Abdomen: soft, non-tender, non-distended, normal bowel sounds   Lab Results: Recent Labs    04/09/20 2300 April 09, 2020 2300 03/25/20 0506 03/25/20 1640 03/25/20 2101  WBC 5.9  --  3.0* 2.4*  --   HGB 9.6*  --  6.5* 8.5*  --   PLT 150   < > 73* 56* 51*  MCV 92.2  --  93.2 87.9  --    < > = values in this interval not displayed.   Recent Labs    03/25/20 0619 03/25/20 1700 03/26/20 0414  NA 114* 122* 127*  K 4.6 3.7 3.4*  CL 85* 91* 91*  CO2 16* 22 26  GLUCOSE 227* 117* 156*  BUN 39* 44* 42*  CREATININE 1.67* 1.28* 1.28*  CALCIUM 5.5* 5.5* 6.0*   Recent Labs    03/25/20 0506 03/25/20 1700 03/26/20 0414  PROT 3.1* 3.3* 3.5*  ALBUMIN 1.4* 1.7* 2.0*  AST 537* 643* 542*  ALT 107* 120* 106*  ALKPHOS 347* 300* 232*  BILITOT 9.8* 10.5* 11.0*  BILIDIR 6.3*  --   --   IBILI 3.5*  --   --    Recent Labs    03/25/20 2101 03/26/20 0414  INR 2.1* 1.9*   Ascites fluid was sterile (cell count, G stain and culture all negative for signs of infection), SAAG is unable to be calculated accurately because lab reported ascites fluid as <1 (serum albumin at the time was 1.4), Total protein was was reported as <3 (also does not help in evaluating the fluid very much, cutoff is generally felt to  be 2.5 not 3)  Medications: Scheduled Meds: . sodium chloride   Intravenous Once  . sodium chloride   Intravenous Once  . chlorhexidine  15 mL Mouth Rinse BID  . Chlorhexidine Gluconate Cloth  6 each Topical Daily  . folic acid  1 mg Oral Daily  . insulin aspart  0-15 Units Subcutaneous Q4H  . [START ON 03/27/2020] LORazepam  0-4 mg Oral Q12H  . mouth rinse  15 mL Mouth Rinse BID  . mouth rinse  15 mL Mouth Rinse q12n4p  . multivitamin with minerals  1 tablet Oral Daily  . pantoprazole (PROTONIX) IV  40 mg Intravenous Q12H  . thiamine  100 mg Oral Daily   Or  . thiamine  100 mg Intravenous Daily   Continuous Infusions: . sodium chloride 250 mL (03/25/20 2130)  . sodium chloride 250 mL (03/25/20 2130)  . ceFEPime (MAXIPIME) IV Stopped (03/26/20 0152)  . metronidazole Stopped (03/26/20 2836)  . norepinephrine (LEVOPHED) Adult infusion 2 mcg/min (03/26/20 0500)  . phytonadione (VITAMIN K) IV Stopped (03/25/20 6294)  . sodium bicarbonate 150 mEq in dextrose 5% 1000 mL 125 mL/hr at 03/26/20 0500  PRN Meds:.docusate sodium, ondansetron (ZOFRAN) IV, polyethylene glycol    Assessment/Plan: 64 y.o. male with MOSF, unclear etiology, likely underlying alcoholic liver disease  Acute viral (hep ABC) panel was negative. ANA, AMA, CMV, EBV ordered and in process. Tylenol level negative.  Ascites fluid was sterile (cell count, G stain and culture all negative for signs of infection) however other than that the fluid test results do not add very much in evaluating for source of the fluid:  SAAG is unable to be calculated accurately because lab reported ascites fluid as <1. The serum albumin at that time was 1.4. The cutoff for portal hypertension evaluation is 1.1. Also, the total protein was was reported as <3. This also does not help in evaluating the fluid very much, cutoff is generally felt to be 2.5 not 3. Should consider re-tapping and sending again for albumin, total protein and would also  add cytology and mycobacterium culture.  Fortunately Cr is improved, LFTs generally a bit improved, INR improved, electrolytes much improved.  DIC? Plts are dropping.  Would still hold on starting steroids for now (acute etoh hepatitis discriminant function is 53 today and so he does qualify for 4 weeks prednisolone course) but considering this pending further clinical course, test results. Really need to make sure there is no underlying infection.  Otherwise would continue current management.   I re-ordered the HIDA that was suggested by general surgery, was not ordered correctly per nuc med.  Rachael Fee, MD  03/26/2020, 7:06 AM [facility] Pager [phone]

## 2020-03-26 NOTE — Progress Notes (Signed)
eLink Physician-Brief Progress Note Patient Name: Cory Aguirre DOB: [birth year] MRN: [id number]   Date of Service  03/26/2020  HPI/Events of Note  Na+ 128, serum calcium 5.9, corrected calcium 7.5  eICU Interventions  Calcium gluconate 2 gm ix x 1, serum Na+ is slowly improving.        Thomasene Lot [name] 03/26/2020, 11:45 PM

## 2020-03-26 NOTE — TOC Initial Note (Signed)
Transition of Care [facility]) - Initial/Assessment Note    Patient Details  Name: Cory Aguirre MRN: [id number] Date of Birth: 1955-09-10  [facility]) CM/SW Contact:    Golda Acre, RN Phone Number: 03/26/2020, 7:52 AM  Clinical Narrative:                 etoh abuse with liver disease and cholecystitis/no surgery at this time due to tocix state of labs.  Peritonial washing was done.  Int and prothrombin time elevated and did rec' one unit of ffp and 1 unit of prepare cryoprecipitation.  ams brought in by sister. Covid neg. From home no pcp plan unstated at this time due loc and condition.. Following for rp0ogress and toc needs-substance abuse resources when awake. Expected Discharge Plan: Home/Self Care Barriers to Discharge: Continued Medical Work up   Patient Goals and CMS Choice Patient states their goals for this hospitalization and ongoing recovery are:: unable to state due to [facility] Medicare.gov Compare Post Acute Care list provided to:: Patient    Expected Discharge Plan and Services Expected Discharge Plan: Home/Self Care   Discharge Planning Services: CM Consult   Living arrangements for the past 2 months: Apartment                                      Prior Living Arrangements/Services Living arrangements for the past 2 months: Apartment Lives with:: Self                   Activities of Daily Living Home Assistive Devices/Equipment: None ADL Screening (condition at time of admission) Patient's cognitive ability adequate to safely complete daily activities?: No Is the patient deaf or have difficulty hearing?: No Does the patient have difficulty seeing, even when wearing glasses/contacts?: No Does the patient have difficulty concentrating, remembering, or making decisions?: Yes Patient able to express need for assistance with ADLs?: Yes Does the patient have difficulty dressing or bathing?: Yes Independently performs ADLs?:  No Communication: Independent Dressing (OT): Needs assistance Is this a change from baseline?: Change from baseline, expected to last >3 days Grooming: Needs assistance Is this a change from baseline?: Change from baseline, expected to last >3 days Feeding: Needs assistance Is this a change from baseline?: Change from baseline, expected to last >3 days Bathing: Needs assistance Is this a change from baseline?: Change from baseline, expected to last >3 days Toileting: Needs assistance Is this a change from baseline?: Change from baseline, expected to last >3days In/Out Bed: Needs assistance Is this a change from baseline?: Change from baseline, expected to last >3 days Walks in Home: Needs assistance Is this a change from baseline?: Change from baseline, expected to last >3 days Does the patient have difficulty walking or climbing stairs?: Yes Weakness of Legs: Both Weakness of Arms/Hands: Both  Permission Sought/Granted                  Emotional Assessment Appearance:: Appears stated age     Orientation: : Fluctuating Orientation (Suspected and/or reported Sundowners) Alcohol / Substance Use: Tobacco Use, Alcohol Use, Illicit Drugs Psych Involvement: No (comment)  Admission diagnosis:  Hyponatremia [E87.1] Hyperammonemia (HCC) [E72.20] Hypoalbuminemia [E88.09] Hypoglycemia [E16.2] Elevated LFTs [R79.89] Liver failure without hepatic coma, unspecified chronicity (HCC) [K72.90] Sepsis, due to unspecified organism, unspecified whether acute organ dysfunction present [facility]) [A41.9] Patient Active Problem List   Diagnosis Date Noted  .  Hyponatremia 03/25/2020  . Elevated LFTs   . Hyperammonemia (HCC)   . Sepsis (HCC)   . Septic shock (HCC)   . Metabolic acidosis   . Acute metabolic encephalopathy   . Coagulopathy (HCC)   . 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 CHEST PAIN UNSPECIFIED 05/18/2009  . ECZEMA 01/25/2009  . KERATOSIS 01/25/2009  . FOOT PAIN 01/25/2009    PCP:  Kristian Covey, MD Pharmacy:   [facility] [phone] - [place], [place] - [address] AT [address] OF [facility] [address] [place] [address] Phone: [phone] Fax: [phone]     Social Determinants of Health (SDOH) Interventions    Readmission Risk Interventions No flowsheet data found.

## 2020-03-27 ENCOUNTER — Inpatient Hospital Stay (HOSPITAL_COMMUNITY): Payer: BLUE CROSS/BLUE SHIELD

## 2020-03-27 DIAGNOSIS — G9341 Metabolic encephalopathy: Secondary | ICD-10-CM | POA: Diagnosis not present

## 2020-03-27 DIAGNOSIS — D689 Coagulation defect, unspecified: Secondary | ICD-10-CM | POA: Diagnosis not present

## 2020-03-27 DIAGNOSIS — D62 Acute posthemorrhagic anemia: Secondary | ICD-10-CM | POA: Diagnosis not present

## 2020-03-27 DIAGNOSIS — K7290 Hepatic failure, unspecified without coma: Secondary | ICD-10-CM | POA: Diagnosis not present

## 2020-03-27 HISTORY — PX: IR ANGIOGRAM SELECTIVE EACH ADDITIONAL VESSEL: IMG667

## 2020-03-27 HISTORY — PX: IR US GUIDE VASC ACCESS RIGHT: IMG2390

## 2020-03-27 HISTORY — PX: IR EMBO ART  VEN HEMORR LYMPH EXTRAV  INC GUIDE ROADMAPPING: IMG5450

## 2020-03-27 HISTORY — PX: IR ANGIOGRAM PELVIS SELECTIVE OR SUPRASELECTIVE: IMG661

## 2020-03-27 LAB — PREPARE RBC (CROSSMATCH)

## 2020-03-27 LAB — COMPREHENSIVE METABOLIC PANEL
ALT: 108 U/L — ABNORMAL HIGH (ref 0–44)
AST: 490 U/L — ABNORMAL HIGH (ref 15–41)
Albumin: 1.6 g/dL — ABNORMAL LOW (ref 3.5–5.0)
Alkaline Phosphatase: 219 U/L — ABNORMAL HIGH (ref 38–126)
Anion gap: 14 (ref 5–15)
BUN: 41 mg/dL — ABNORMAL HIGH (ref 8–23)
CO2: 22 mmol/L (ref 22–32)
Calcium: 6.2 mg/dL — CL (ref 8.9–10.3)
Chloride: 92 mmol/L — ABNORMAL LOW (ref 98–111)
Creatinine, Ser: 1.67 mg/dL — ABNORMAL HIGH (ref 0.61–1.24)
GFR calc Af Amer: 50 mL/min — ABNORMAL LOW (ref >60)
GFR calc non Af Amer: 43 mL/min — ABNORMAL LOW (ref >60)
Glucose, Bld: 121 mg/dL — ABNORMAL HIGH (ref 70–99)
Potassium: 3.8 mmol/L (ref 3.5–5.1)
Sodium: 128 mmol/L — ABNORMAL LOW (ref 135–145)
Total Bilirubin: 12.1 mg/dL — ABNORMAL HIGH (ref 0.3–1.2)
Total Protein: 3.2 g/dL — ABNORMAL LOW (ref 6.5–8.1)

## 2020-03-27 LAB — GLUCOSE, CAPILLARY
Glucose-Capillary: 107 mg/dL — ABNORMAL HIGH (ref 70–99)
Glucose-Capillary: 114 mg/dL — ABNORMAL HIGH (ref 70–99)
Glucose-Capillary: 120 mg/dL — ABNORMAL HIGH (ref 70–99)
Glucose-Capillary: 123 mg/dL — ABNORMAL HIGH (ref 70–99)
Glucose-Capillary: 126 mg/dL — ABNORMAL HIGH (ref 70–99)
Glucose-Capillary: 136 mg/dL — ABNORMAL HIGH (ref 70–99)

## 2020-03-27 LAB — CBC
HCT: 18 % — ABNORMAL LOW (ref 39.0–52.0)
HCT: 16.5 % — ABNORMAL LOW (ref 39.0–52.0)
Hemoglobin: 6.3 g/dL — CL (ref 13.0–17.0)
Hemoglobin: 5.8 g/dL — CL (ref 13.0–17.0)
MCH: 30.7 pg (ref 26.0–34.0)
MCH: 31 pg (ref 26.0–34.0)
MCHC: 35 g/dL (ref 30.0–36.0)
MCHC: 35.2 g/dL (ref 30.0–36.0)
MCV: 87.8 fL (ref 80.0–100.0)
MCV: 88.2 fL (ref 80.0–100.0)
Platelets: 48 10*3/uL — ABNORMAL LOW (ref 150–400)
Platelets: 70 10*3/uL — ABNORMAL LOW (ref 150–400)
RBC: 2.05 MIL/uL — ABNORMAL LOW (ref 4.22–5.81)
RBC: 1.87 MIL/uL — ABNORMAL LOW (ref 4.22–5.81)
RDW: 17.4 % — ABNORMAL HIGH (ref 11.5–15.5)
RDW: 17.2 % — ABNORMAL HIGH (ref 11.5–15.5)
WBC: 2.3 10*3/uL — ABNORMAL LOW (ref 4.0–10.5)
WBC: 2.7 10*3/uL — ABNORMAL LOW (ref 4.0–10.5)
nRBC: 0.9 % — ABNORMAL HIGH (ref 0.0–0.2)
nRBC: 0 % (ref 0.0–0.2)

## 2020-03-27 LAB — MAGNESIUM: Magnesium: 2.5 mg/dL — ABNORMAL HIGH (ref 1.7–2.4)

## 2020-03-27 LAB — ALBUMIN, FLUID (OTHER): Albumin, Body Fluid Other: 0.3 g/dL

## 2020-03-27 LAB — PHOSPHORUS: Phosphorus: 3.2 mg/dL (ref 2.5–4.6)

## 2020-03-27 LAB — OCCULT BLOOD X 1 CARD TO LAB, STOOL: Fecal Occult Bld: POSITIVE — AB

## 2020-03-27 LAB — CMV IGM: CMV IgM: <30 AU/mL (ref 0.0–29.9)

## 2020-03-27 LAB — MITOCHONDRIAL ANTIBODIES: Mitochondrial M2 Ab, IgG: <20 Units (ref 0.0–20.0)

## 2020-03-27 LAB — LACTIC ACID, PLASMA
Lactic Acid, Venous: 4.7 mmol/L (ref 0.5–1.9)
Lactic Acid, Venous: 6.6 mmol/L (ref 0.5–1.9)

## 2020-03-27 LAB — PROTIME-INR
INR: 2.3 — ABNORMAL HIGH (ref 0.8–1.2)
INR: 1.7 — ABNORMAL HIGH (ref 0.8–1.2)
Prothrombin Time: 24.6 seconds — ABNORMAL HIGH (ref 11.4–15.2)
Prothrombin Time: 18.9 seconds — ABNORMAL HIGH (ref 11.4–15.2)

## 2020-03-27 LAB — AMMONIA: Ammonia: 53 umol/L — ABNORMAL HIGH (ref 9–35)

## 2020-03-27 MED ORDER — LACTATED RINGERS IV BOLUS
1000.0000 mL | Freq: Once | INTRAVENOUS | Status: AC
  Administered 2020-03-27: 1000 mL via INTRAVENOUS

## 2020-03-27 MED ORDER — SODIUM CHLORIDE 0.9% IV SOLUTION: Freq: Once | INTRAVENOUS | Status: DC

## 2020-03-27 MED ORDER — FENTANYL CITRATE (PF) 100 MCG/2ML IJ SOLN
INTRAMUSCULAR | Status: AC
  Filled 2020-03-27: qty 2

## 2020-03-27 MED ORDER — IOHEXOL 300 MG/ML  SOLN
100.0000 mL | Freq: Once | INTRAMUSCULAR | Status: AC | PRN
  Administered 2020-03-27: 40 mL via INTRA_ARTERIAL

## 2020-03-27 MED ORDER — SODIUM CHLORIDE 0.9% IV SOLUTION: Freq: Once | INTRAVENOUS | Status: AC

## 2020-03-27 MED ORDER — IOHEXOL 300 MG/ML  SOLN: 100.0000 mL | Freq: Once | INTRAMUSCULAR | Status: DC | PRN

## 2020-03-27 MED ORDER — PREDNISOLONE 5 MG PO TABS
40.0000 mg | ORAL_TABLET | Freq: Every day | ORAL | Status: DC
  Administered 2020-03-27: 40 mg via ORAL
  Filled 2020-03-27 (×3): qty 8

## 2020-03-27 MED ORDER — IOHEXOL 300 MG/ML  SOLN
100.0000 mL | Freq: Once | INTRAMUSCULAR | Status: AC | PRN
  Administered 2020-03-27: 42 mL via INTRA_ARTERIAL

## 2020-03-27 MED ORDER — LACTATED RINGERS IV BOLUS
500.0000 mL | Freq: Once | INTRAVENOUS | Status: AC
  Administered 2020-03-27: 500 mL via INTRAVENOUS

## 2020-03-27 MED ORDER — CALCIUM GLUCONATE-NACL 1-0.675 GM/50ML-% IV SOLN
1.0000 g | Freq: Once | INTRAVENOUS | Status: AC
  Administered 2020-03-27: 1000 mg via INTRAVENOUS
  Filled 2020-03-27: qty 50

## 2020-03-27 MED ORDER — IOHEXOL 300 MG/ML  SOLN
100.0000 mL | Freq: Once | INTRAMUSCULAR | Status: AC | PRN
  Administered 2020-03-27: 80 mL via INTRAVENOUS

## 2020-03-27 MED ORDER — IOHEXOL 9 MG/ML PO SOLN
ORAL | Status: AC
  Filled 2020-03-27: qty 1000

## 2020-03-27 MED ORDER — LACTULOSE 10 GM/15ML PO SOLN
10.0000 g | Freq: Three times a day (TID) | ORAL | Status: DC
  Administered 2020-03-27: 10 g via ORAL
  Filled 2020-03-27 (×2): qty 15

## 2020-03-27 MED ORDER — SODIUM CHLORIDE (PF) 0.9 % IJ SOLN
INTRAMUSCULAR | Status: AC
  Filled 2020-03-27: qty 50

## 2020-03-27 MED ORDER — CALCIUM GLUCONATE-NACL 2-0.675 GM/100ML-% IV SOLN
2.0000 g | Freq: Once | INTRAVENOUS | Status: AC
  Administered 2020-03-27: 2000 mg via INTRAVENOUS
  Filled 2020-03-27: qty 100

## 2020-03-27 MED ORDER — LIDOCAINE HCL 1 % IJ SOLN
INTRAMUSCULAR | Status: AC
  Filled 2020-03-27: qty 20

## 2020-03-27 NOTE — Progress Notes (Addendum)
Assessment & Plan: Acute on chronic liver disease Coagulopathic with elevated INR Hyponatremia AKI Acute metabolic encephalopathy Anemia Thrombocytopenia Leukopenia Hx alcohol abuse  Septic shock with acidosis - lactate elevated this AM  FEN: N.p.o./IV fluids BD:[id number]/ASTMHD 8/5  Plan: HIDA demonstrated filling of gallbladder but contrast did not obviously reach duodenum.  Given current status with impending liver failure and coagulopathy, operative intervention is not an option at this point anyway.  Nursing to check bladder scan, flush Foley, possibly replace.  Paracentesis repeated yesterday.  Results pending.  Per medical / GI / CCM services  Surgery will follow peripherally        Darnell Level, MD       [facility]       Office: [phone]   Chief Complaint: Sepsis, altered mental status  Subjective: Patient in ICU, nursing at bedside; no specific complaints  Objective: Vital signs in last 24 hours: Temp:  [95.7 F (35.4 C)-98.1 F (36.7 C)] 97.5 F (36.4 C) (08/07 0730) Pulse Rate:  [86-114] 110 (08/07 0730) Resp:  [15-34] 22 (08/07 0730) BP: (80-124)/(46-76) 95/60 (08/07 0730) SpO2:  [90 %-100 %] 93 % (08/07 0730) Last BM Date: 03/26/20  Intake/Output from previous day: 08/06 0701 - 08/07 0700 In: 4731 [P.O.:650; I.V.:2387.6; Blood:766.6; IV Piggyback:926.8] Out: 310 [Urine:310] Intake/Output this shift: No intake/output data recorded.  Physical Exam: HEENT - jaundiced conjunctiva, mucous membranes Neck - soft Abdomen - moderated distension, fluid wave; no tenderness upper abdomen, no mass; tender to palpation suprapubic area, no mass; no surgical scars; no obvious hernia Ext - no edema, non-tender Neuro - arouses to stimulus; mumbles  Lab Results:  Recent Labs    03/26/20 0831 03/27/20 0509  WBC 1.2* 2.3*  HGB 6.8* 6.3*  HCT 19.4* 18.0*  PLT 38* 48*   BMET Recent Labs    03/26/20 2009  03/27/20 0509  NA 128* 128*  K 3.5 3.8  CL 91* 92*  CO2 27 22  GLUCOSE 82 121*  BUN 42* 41*  CREATININE 1.26* 1.67*  CALCIUM 5.9* 6.2*   PT/INR Recent Labs    03/26/20 0414 03/27/20 0509  LABPROT 21.2* 24.6*  INR 1.9* 2.3*   Comprehensive Metabolic Panel:    Component Value Date/Time   NA 128 (L) 03/27/2020 0509   NA 128 (L) 03/26/2020 2009   K 3.8 03/27/2020 0509   K 3.5 03/26/2020 2009   CL 92 (L) 03/27/2020 0509   CL 91 (L) 03/26/2020 2009   CO2 22 03/27/2020 0509   CO2 27 03/26/2020 2009   BUN 41 (H) 03/27/2020 0509   BUN 42 (H) 03/26/2020 2009   CREATININE 1.67 (H) 03/27/2020 0509   CREATININE 1.26 (H) 03/26/2020 2009   GLUCOSE 121 (H) 03/27/2020 0509   GLUCOSE 82 03/26/2020 2009   CALCIUM 6.2 (LL) 03/27/2020 0509   CALCIUM 5.9 (LL) 03/26/2020 2009   AST 490 (H) 03/27/2020 0509   AST 609 (H) 03/26/2020 2009   ALT 108 (H) 03/27/2020 0509   ALT 120 (H) 03/26/2020 2009   ALKPHOS 219 (H) 03/27/2020 0509   ALKPHOS 248 (H) 03/26/2020 2009   BILITOT 12.1 (H) 03/27/2020 0509   BILITOT 13.3 (H) 03/26/2020 2009   PROT 3.2 (L) 03/27/2020 0509   PROT 3.6 (L) 03/26/2020 2009   ALBUMIN 1.6 (L) 03/27/2020 0509   ALBUMIN 2.0 (L) 03/26/2020 2009    Studies/Results: DG Chest 1 View  Result Date: 03/25/2020 CLINICAL DATA:  Central line placement EXAM: CHEST  1  VIEW COMPARISON:  Same day chest radiograph FINDINGS: Interval placement of left subclavian approach central venous catheter with distal tip terminating at the level of the superior cavoatrial junction. Stable heart size. Atherosclerotic calcification of the aortic knob. Low lung volumes with hazy bibasilar opacities. Trace bilateral pleural effusions not excluded. No pneumothorax. IMPRESSION: 1. Interval placement of left subclavian approach central venous catheter with distal tip terminating at the level of the superior cavoatrial junction. No pneumothorax. 2. New hazy bibasilar opacities, which may reflect atelectasis  accentuated by low lung volumes. Mild edema and/or small bilateral pleural effusions could also have a similar appearance. Electronically Signed   By: Duanne Guess D.O.   On: 03/25/2020 13:14   NM Hepatobiliary Liver Func  Result Date: 03/26/2020 CLINICAL DATA:  Chronic upper abdominal pain. Jaundice with elevated liver function studies. EXAM: NUCLEAR MEDICINE HEPATOBILIARY IMAGING TECHNIQUE: Sequential images of the abdomen were obtained out to 60 minutes following intravenous administration of radiopharmaceutical. RADIOPHARMACEUTICALS:  5.3 mCi Tc-25m  Choletec IV COMPARISON:  Ultrasound and CT 03/25/2020. FINDINGS: Mildly delayed hepatic uptake with limited biliary excretion. Gallbladder activity is visualized, consistent with patency of cystic duct. No passage of biliary activity into the small bowel is identified, likely related to hepatic dysfunction. IMPRESSION: 1. The cystic duct is patent with filling of the gallbladder lumen. 2. Delayed hepatic uptake and limited biliary excretion with no passage of activity into the small bowel, likely related to hepatic dysfunction. No biliary dilatation on recent imaging. Electronically Signed   By: Carey Bullocks M.D.   On: 03/26/2020 12:42      Darnell Level 03/27/2020  Patient ID: Cory Aguirre, male   DOB: 11-10-1955, 64 y.o.   MRN: [id number]

## 2020-03-27 NOTE — Progress Notes (Addendum)
NAME:  Cory Aguirre, MRN:  [id number], DOB:  June 16, 1956, LOS: 2 ADMISSION DATE:  04-22-20, CONSULTATION DATE:  03/27/20  REFERRING MD:  Elpidio Anis PA-C CHIEF COMPLAINT:  Sepsis   Brief History   Cory Aguirre is a 64 y.o. male with h/o alcohol abuse who was admitted from the ED after being found hypotensive and altered in his home. Initial BP was 50s/30s, which subsequently responded well to volume resuscitation. He was found to be febrile to 101.5 F. Labs notable for hyponatremia and significantly elevated LFTs and lactic acidosis.    ->prodrome of about 1 week not feeling well; loose dark stools, nausea poor po intake. 70 lb wt loss last 64mo. Syncopal episode on toilet.     Past Medical History  Alcohol abuse  Significant Hospital Events   8/5 admitted. Volume responsive. CVL placed-->started on pressors. Diagnostic paracentesis completed: Admitted w/ sepsis/septic shock working dx source was enterocolitis vs portal enteropathy +/- SBP; IVFs started, cultures sent. abx initiated. fluid was sterile (cell count, G stain and culture all negative for signs of infection) however other than that the fluid test results do not add very much in evaluating for source of the fluid:  SAAG is unable to be calculated accurately because lab reported ascites fluid as <1. The serum albumin at that time was 1.4. Acute hepatitis panel negative. Starting to wonder if this is more acute liver failure in setting of acute alcoholic hepatitis than sepsis.   8/6: HIDA scan completed.  Hemoglobin down to 6.8, transfusing.  CVP 3.  Still volume depleted.  More awake.  Still pressor dependent.  Renal function somewhat improved.  Consults:  PCCM Surg, IR and GI all consulted.   Procedures:  None  Significant Diagnostic Tests:  CT a/p with contrast (8/5):IMPRESSION:1. Small right greater than left pleural effusions.2. Suspected liver disease. There is splenomegaly and moderate volume of ascites within the abdomen  and pelvis.3. Diffuse fluid within the small bowel. Mild thickened appearanceof left upper quadrant jejunal small bowel loops with mild wallthickening of the ascending colon and hepatic flexure. Findingscould be secondary to enteritis/colitis versus portal enteropathy.4. Hyperdense sludge within the gallbladder. Possible gallbladderwall thickening or edema, correlation with right upper quadrant ultrasound could be obtained as indicated RUQ [place] 8/5: 1. Gallbladder is filled with sludge, thickened wall, and pericholecystic edema. Positive Murphy's sign. Changes may represent acute cholecystitis in the appropriate clinical setting. 2. Heterogeneous liver parenchymal echotexture suggesting cirrhosis or fatty infiltration. 3. Upper abdominal ascites. Acute viral (hep ABC) panel 8/5:  Negative. Ana>>> AMA>>> CMV>>> EBV>>>  Micro Data:  Blood cultures (8/5) pending 8/4 stool culture>>> 8/5 UC: neg 8/5 ascites >>>  Antimicrobials:  Vancomycin (8/5) Cefepime, metronidazole (8/5-)   Interim history/subjective:  Somewhat lethargic  Objective   Blood pressure 112/66, pulse (!) 102, temperature (!) 96.8 F (36 C), resp. rate 16, height 5\' 9"  (1.753 m), weight 68.8 kg, SpO2 97 %. CVP:  [4 mmHg] 4 mmHg      Intake/Output Summary (Last 24 hours) at 03/27/2020 1115 Last data filed at 03/27/2020 1052 Gross per 24 hour  Intake 4001.97 ml  Output 310 ml  Net 3691.97 ml   Filed Weights   2020-04-22 2236 03/25/20 0900 03/26/20 0500  Weight: 79.4 kg 63.9 kg 68.8 kg    Examination:  General: Frail jaundiced cachectic male currently on vasopressor support awake alert HEENT: Sclera is jaundiced.  Lymphadenopathy is appreciated Neuro: Somewhat lethargic arouses and follows commands CV: Heart tones regular PULM: Decreased  breath sounds throughout primarily sats of 97% GI: Abdomen is distended tender status post paracentesis on 03/26/2020 GU: Urine is decreased developed noted to be clinically change Foley  catheter Extremities: Jaundice and dry Skin: no rashes or lesions   Resolved Hospital Problem list    Syncope: Likely orthostatic versus vasovagal in setting of recent poor PO intake. Assessment & Plan:    Circulatory shock w/ severe lactic acidosis , presumptive dx is:  intra-abdominal source: r/o cholecystitis, +/- colitis (ascites from paracentesis was sterile so seems like this is NOT SBP)-->lack of abd pain on exam raising concern that this all could be 2/2 decompensated liver failure  -cultures negative to date -no abd pain.  03/27/2020 lactic acid continues to rise Plan  continue vancomycin and cefepime and Flagyl Continue multivitamin Transfuse per protocol Vasopressors Status post paracentesis on 03/26/2020 Trend lactic acid Transfuse 2 units of fresh plasma    acute on chronic liver disease w/ acute alcoholic hepatitis and acute liver failure  MELDNa score 36 His discriminant function is 53 so qualifies for 4 week pred course  -LFTs a little better -acute viral hep panel negative.  -Apap neg Plan Trend LFTs Steroids in the future Transfuse 2 units respiratory no plasma continue to monitor   AKI 2/2 volume depletion -->continues to improve Lab Results  Component Value Date   CREATININE 1.67 (H) 03/27/2020   CREATININE 1.26 (H) 03/26/2020   CREATININE 1.28 (H) 03/26/2020   .Plan Continue to monitor Avoid nephrotoxins Check renal US 8/7 for completeness  Fluid and electrolyte imbalance: hypovolemic hyponatremia (has been improving w/ NaCl replacement), hypokalemia Recent Labs  Lab 03/26/20 0414 03/26/20 2009 03/27/20 0509  K 3.4* 3.5 3.8    Plan Monitor replete as needed   Acute metabolic encephalopathy: sepsis +/- hepatic  Plan Continue CIWA protocol  Thrombocytopenia in setting of CLD-->PLTs cont to drop Plan Continue to monitor and treat as needed  Coagulopathy in setting of CLD Lab Results  Component Value Date   INR 2.3 (H) 03/27/2020     INR 1.9 (H) 03/26/2020   INR 2.1 (H) 03/25/2020    Plan INR remains elevated Continue to monitor Transfuse 2 units of fresh frozen plasma Consult Microcytic anemia w/ progressive anemia.  Recent Labs    03/26/20 0831 03/27/20 0509  HGB 6.8* 6.3*    Plan Transfuse per protocol  Mild hyperglycemia CBG (last 3)  Recent Labs    03/26/20 2348 03/27/20 0349 03/27/20 0744  GLUCAP 116* 114* 107*    Plan Sliding-scale insulin protocol  Best practice:  Diet: NPO Pain/Anxiety/Delirium protocol (if indicated): N/A VAP protocol (if indicated): N/A DVT prophylaxis: SCD GI prophylaxis: PPI Glucose control: SSI Mobility: Bed rest Code Status: Full Code Family Communication: Neldon Labella Disposition: Admit to ICU  Labs   CBC: Recent Labs  Lab 03/28/2020 2300 04/09/2020 2300 03/25/20 0506 03/25/20 1640 03/25/20 2101 03/26/20 0831 03/27/20 0509  WBC 5.9  --  3.0* 2.4*  --  1.2* 2.3*  NEUTROABS 5.0  --   --   --   --  1.0*  --   HGB 9.6*  --  6.5* 8.5*  --  6.8* 6.3*  HCT 28.2*  --  19.1* 24.0*  --  19.4* 18.0*  MCV 92.2  --  93.2 87.9  --  87.4 87.8  PLT 150   < > 73* 56* 51* 38* 48*   < > = values in this interval not displayed.    Basic Metabolic Panel: Recent  Labs  Lab 03/25/20 0214 03/25/20 0506 03/25/20 0619 03/25/20 1700 03/26/20 0414 03/26/20 2009 03/27/20 0509  NA   < >  --  114* 122* 127* 128* 128*  K   < >  --  4.6 3.7 3.4* 3.5 3.8  CL   < >  --  85* 91* 91* 91* 92*  CO2   < >  --  16* 22 26 27 22   GLUCOSE   < >  --  227* 117* 156* 82 121*  BUN   < >  --  39* 44* 42* 42* 41*  CREATININE   < >  --  1.67* 1.28* 1.28* 1.26* 1.67*  CALCIUM   < >  --  5.5* 5.5* 6.0* 5.9* 6.2*  MG  --  2.8*  --   --  2.9*  --  2.5*  PHOS  --  4.6  --   --  2.4*  --  3.2   < > = values in this interval not displayed.   GFR: Estimated Creatinine Clearance: 44.1 mL/min (A) (by C-G formula based on SCr of 1.67 mg/dL (H)). Recent Labs  Lab 03/25/20 0506  03/25/20 0506 03/25/20 1640 03/25/20 1800 03/26/20 0559 03/26/20 0831 03/27/20 0046 03/27/20 0509 03/27/20 0626  WBC 3.0*  --  2.4*  --   --  1.2*  --  2.3*  --   LATICACIDVEN 7.0*   < >  --  2.5* 3.1*  --  4.7*  --  6.6*   < > = values in this interval not displayed.    Liver Function Tests: Recent Labs  Lab 03/25/20 0506 03/25/20 1700 03/26/20 0414 03/26/20 2009 03/27/20 0509  AST 537* 643* 542* 609* 490*  ALT 107* 120* 106* 120* 108*  ALKPHOS 347* 300* 232* 248* 219*  BILITOT 9.8* 10.5* 11.0* 13.3* 12.1*  PROT 3.1* 3.3* 3.5* 3.6* 3.2*  ALBUMIN 1.4* 1.7* 2.0* 2.0* 1.6*   Recent Labs  Lab 03/25/20 0506  LIPASE 55*   Recent Labs  Lab 2020-03-31 2300  AMMONIA 45*    ABG No results found for: PHART, PCO2ART, PO2ART, HCO3, TCO2, ACIDBASEDEF, O2SAT   Coagulation Profile: Recent Labs  Lab 03-31-20 2300 03/25/20 0506 03/25/20 2101 03/26/20 0414 03/27/20 0509  INR 3.1* 4.3* 2.1* 1.9* 2.3*      CBG: Recent Labs  Lab 03/26/20 1953 03/26/20 2031 03/26/20 2348 03/27/20 0349 03/27/20 0744  GLUCAP 67* 91 116* 114* 107*     My cct 30 minutes    05/27/20 Lorraina Spring ACNP Acute Care Nurse Practitioner [facility] Pulmonary/Critical Care Please consult Amion 03/27/2020, 11:15 AM

## 2020-03-27 NOTE — Progress Notes (Signed)
eLink Physician-Brief Progress Note Patient Name: MAKIH STEFANKO DOB: [birth year] MRN: [id number]   Date of Service  03/27/2020  HPI/Events of Note  Elevated serum lactic acid + low urine output  eICU Interventions  Lactated Ringers 1000 ml fluid bolus x 1.        Thomasene Lot Loredana Medellin 03/27/2020, 6:09 AM

## 2020-03-27 NOTE — Procedures (Signed)
Interventional Radiology Procedure:   Indications: Hemorrhage in anterior lower abdomen and pelvis  Procedure: Pelvic arteriogram and coil embolization  Findings: Active bleeding from a branch of left inferior epigastric artery.  Feeding branch was embolized with coils.  No bleeding identified from right external iliac artery or right inferior epigastric artery. Right groin closed with Angioseal closure device.  Complications: None     EBL: less than 20 ml  Plan: Bedrest.  Follow CBC.     Raequan Vanschaick R. Lowella Dandy, MD  Pager: [phone]

## 2020-03-27 NOTE — Progress Notes (Addendum)
Progress Note   Subjective  Chief Complaint: Multiorgan system failure, underlying alcoholic liver disease, elevated LFTs  Today, patient opens his eyes to my voice and is able to tell me that he is just tired and denies any new abdominal pain or other GI complaints.  Per nursing staff C. difficile and GI path panel have been ordered, but patient has only had a "smear" of stool since he has been here over the past 3 days, they are asking for orders to be discontinued so that they can stop enteric precautions.   Objective   Vital signs in last 24 hours: Temp:  [95.7 F (35.4 C)-98.1 F (36.7 C)] 97 F (36.1 C) (08/07 0841) Pulse Rate:  [86-114] 108 (08/07 0841) Resp:  [15-34] 30 (08/07 0841) BP: (80-124)/(46-76) 86/58 (08/07 0841) SpO2:  [90 %-100 %] 95 % (08/07 0841) Last BM Date: 03/26/20 General:    Jaundiced, critically ill, white male  Heart:  Regular rate and rhythm; no murmurs Lungs: Respirations even and unlabored, lungs CTA bilaterally Abdomen:  Soft, nontender and nondistended. Normal bowel sounds.  Intake/Output from previous day: 08/06 0701 - 08/07 0700 In: 4731 [P.O.:650; I.V.:2387.6; Blood:766.6; IV Piggyback:926.8] Out: 310 [Urine:310]  Lab Results: Recent Labs    03/25/20 1640 03/25/20 1640 03/25/20 2101 03/26/20 0831 03/27/20 0509  WBC 2.4*  --   --  1.2* 2.3*  HGB 8.5*  --   --  6.8* 6.3*  HCT 24.0*  --   --  19.4* 18.0*  PLT 56*   < > 51* 38* 48*   < > = values in this interval not displayed.   BMET Recent Labs    03/26/20 0414 03/26/20 2009 03/27/20 0509  NA 127* 128* 128*  K 3.4* 3.5 3.8  CL 91* 91* 92*  CO2 26 27 22   GLUCOSE 156* 82 121*  BUN 42* 42* 41*  CREATININE 1.28* 1.26* 1.67*  CALCIUM 6.0* 5.9* 6.2*   LFT Recent Labs    03/25/20 0506 03/25/20 1700 03/27/20 0509  PROT 3.1*   < > 3.2*  ALBUMIN 1.4*   < > 1.6*  AST 537*   < > 490*  ALT 107*   < > 108*  ALKPHOS 347*   < > 219*  BILITOT 9.8*   < > 12.1*  BILIDIR 6.3*   --   --   IBILI 3.5*  --   --    < > = values in this interval not displayed.   PT/INR Recent Labs    03/26/20 0414 03/27/20 0509  LABPROT 21.2* 24.6*  INR 1.9* 2.3*    Studies/Results: DG Chest 1 View  Result Date: 03/25/2020 CLINICAL DATA:  Central line placement EXAM: CHEST  1 VIEW COMPARISON:  Same day chest radiograph FINDINGS: Interval placement of left subclavian approach central venous catheter with distal tip terminating at the level of the superior cavoatrial junction. Stable heart size. Atherosclerotic calcification of the aortic knob. Low lung volumes with hazy bibasilar opacities. Trace bilateral pleural effusions not excluded. No pneumothorax. IMPRESSION: 1. Interval placement of left subclavian approach central venous catheter with distal tip terminating at the level of the superior cavoatrial junction. No pneumothorax. 2. New hazy bibasilar opacities, which may reflect atelectasis accentuated by low lung volumes. Mild edema and/or small bilateral pleural effusions could also have a similar appearance. Electronically Signed   By: 05/25/2020 D.O.   On: 03/25/2020 13:14   NM Hepatobiliary Liver Func  Result Date: 03/26/2020 CLINICAL DATA:  Chronic upper abdominal pain. Jaundice with elevated liver function studies. EXAM: NUCLEAR MEDICINE HEPATOBILIARY IMAGING TECHNIQUE: Sequential images of the abdomen were obtained out to 60 minutes following intravenous administration of radiopharmaceutical. RADIOPHARMACEUTICALS:  5.3 mCi Tc-26m  Choletec IV COMPARISON:  Ultrasound and CT 03/25/2020. FINDINGS: Mildly delayed hepatic uptake with limited biliary excretion. Gallbladder activity is visualized, consistent with patency of cystic duct. No passage of biliary activity into the small bowel is identified, likely related to hepatic dysfunction. IMPRESSION: 1. The cystic duct is patent with filling of the gallbladder lumen. 2. Delayed hepatic uptake and limited biliary excretion with no  passage of activity into the small bowel, likely related to hepatic dysfunction. No biliary dilatation on recent imaging. Electronically Signed   By: Carey Bullocks M.D.   On: 03/26/2020 12:42    Assessment / Plan:   Assessment: 1.  Multiorgan system failure: Unclear etiology 2.  Likely underlying alcoholic liver disease: LFTs still elevated, INR increased today 1.9--> 2.3 3.  Ascites 4. Hepatorenal syndrome? : Creatinine increasing today 1.26--> 1.67  Plan: 1.  Called lab who explained that their instruments are not set to give them any different numbers less than three for a total protein are less than one for albumin. So we can not be more exact.  The albumin apparently was sent to [facility] this time and it takes 3 to 5 days to come back.  All of this makes these lab results very unhelpful. 2.  Continue current supportive measures 3.  We will start Prednisolone 40mg  daily for four weeks today as this is likely etoh hepatitis 4.  Please await any further recommendations from Dr. later today  Thank you for kind consultation, we will continue to follow along.   LOS: 2 days   Christella Hartigan  03/27/2020, 9:11 AM   ________________________________________________________________________  05/27/2020 GI MD note:  I personally examined the patient, reviewed the data and agree with the assessment and plan described above.  Imperfect lab workflows described above. Overall I think it is most likely that he has severe alc hepatitis with liver failure.  He has been drinking even very recently and so is not a liver transplant candidate.  I think it is probably safe to start steroids (4 weeks prednisolone 40mg  daily protocol).  Continue supportive care. Will follow along.   Corinda Gubler, MD [facility] Pager [phone]

## 2020-03-27 NOTE — Progress Notes (Signed)
eLink Physician-Brief Progress Note Patient Name: JERAMIA SALEEBY DOB: 05/03/1956 MRN: [id number]   Date of Service  03/27/2020  HPI/Events of Note  Hemoglobin 6.3 gm, Calcium 6.2 gm  eICU Interventions  Transfuse 1 unit PRBC, Calcium gluconate 2 gm iv bolus x 1        Commodore Bellew U Nehemiah Mcfarren 03/27/2020, 6:55 AM

## 2020-03-27 NOTE — Consult Note (Signed)
Urology Consult   Physician requesting consult: Karie Fetch, DO  Reason for consult: Concern for bladder clot  History of Present Illness: Cory Aguirre is a 64 y.o. male with history of alcoholic hepatitis currently admitted for likely sepsis/shock, s/p paracentesis on 8/5, for whom urology is consulted regarding recent decrease in urine output and RUS showing possible clot in the bladder with concern for malpositioned foley catheter.  Per patient's nurse, catheter was exchanged this morning, and has had very low output. Output has been pink/red in color. Bladder scan was noted to be 400c. Of note, patient does have significant ascites. He is complaining of abdominal pain.   His hb is 6.3 today from 6.5 yesterday, he received one unit of blood today.   He denies prior history of prostate or bladder surgery, denies prior history of UTI or voiding dysfunction.   Medical History: Hepatitis Alcohol abuse  History reviewed. No pertinent surgical history.  Current Hospital Medications:  Home Meds:  No current facility-administered medications on file prior to encounter.   Current Outpatient Medications on File Prior to Encounter  Medication Sig Dispense Refill  . pantoprazole (PROTONIX) 20 MG tablet Take 1 tablet (20 mg total) by mouth daily. (Patient not taking: Reported on 04/06/2020) 30 tablet 0     Scheduled Meds: . sodium chloride   Intravenous Once  . sodium chloride   Intravenous Once  . sodium chloride   Intravenous Once  . sodium chloride   Intravenous Once  . chlorhexidine  15 mL Mouth Rinse BID  . Chlorhexidine Gluconate Cloth  6 each Topical Daily  . folic acid  1 mg Oral Daily  . insulin aspart  0-15 Units Subcutaneous Q4H  . lactulose  10 g Oral TID  . LORazepam  0-4 mg Oral Q12H  . mouth rinse  15 mL Mouth Rinse BID  . mouth rinse  15 mL Mouth Rinse q12n4p  . multivitamin with minerals  1 tablet Oral Daily  . pantoprazole (PROTONIX) IV  40 mg Intravenous Q12H  .  prednisoLONE  40 mg Oral QAC breakfast  . thiamine  100 mg Oral Daily   Or  . thiamine  100 mg Intravenous Daily   Continuous Infusions: . ceFEPime (MAXIPIME) IV Stopped (03/27/20 1246)  . dextrose 5% lactated ringers 50 mL/hr at 03/27/20 0546  . lactated ringers 75 mL/hr at 03/27/20 0257  . metronidazole Stopped (03/27/20 0615)  . norepinephrine (LEVOPHED) Adult infusion 5 mcg/min (03/27/20 1223)  . phytonadione (VITAMIN K) IV Stopped (03/27/20 1133)   PRN Meds:.docusate sodium, ondansetron (ZOFRAN) IV, polyethylene glycol  Allergies: No Known Allergies  History reviewed. No pertinent family history.  Social History:  reports that he has never smoked. He uses smokeless tobacco. He reports current alcohol use. He reports that he does not use drugs.  ROS: A complete review of systems was performed.  All systems are negative except for pertinent findings as noted.  Physical Exam:  Vital signs in last 24 hours: Temp:  [95 F (35 C)-98.1 F (36.7 C)] 96.8 F (36 C) (08/07 1448) Pulse Rate:  [86-114] 103 (08/07 1448) Resp:  [15-37] 23 (08/07 1448) BP: (80-127)/(46-85) 127/72 (08/07 1448) SpO2:  [76 %-100 %] 98 % (08/07 1448) Constitutional: Intermittently alert, lethargic. Scleral icterus, jaundiced Cardiovascular: Regular rate and rhythm Respiratory: Normal respiratory effort GI: Palpable firmness in the suprapubic region, tender to palpation. Upper abdomen softer though still tender GU: Foley catheter in place, minimal urine output but easily able to irrigate clear  yellow urine Lymphatic: No lymphadenopathy Neurologic: Grossly intact, no focal deficits Psychiatric: Normal mood and affect  Laboratory Data:  Recent Labs    04/11/2020 2300 04/07/2020 2300 03/25/20 0506 03/25/20 1640 03/25/20 2101 03/26/20 0831 03/27/20 0509  WBC 5.9  --  3.0* 2.4*  --  1.2* 2.3*  HGB 9.6*  --  6.5* 8.5*  --  6.8* 6.3*  HCT 28.2*  --  19.1* 24.0*  --  19.4* 18.0*  PLT 150   < > 73* 56*  51* 38* 48*   < > = values in this interval not displayed.    Recent Labs    03/25/20 0619 03/25/20 1700 03/26/20 0414 03/26/20 2009 03/27/20 0509  NA 114* 122* 127* 128* 128*  K 4.6 3.7 3.4* 3.5 3.8  CL 85* 91* 91* 91* 92*  GLUCOSE 227* 117* 156* 82 121*  BUN 39* 44* 42* 42* 41*  CALCIUM 5.5* 5.5* 6.0* 5.9* 6.2*  CREATININE 1.67* 1.28* 1.28* 1.26* 1.67*     Results for orders placed or performed during the hospital encounter of 03/21/2020 (from the past 24 hour(s))  Glucose, capillary     Status: None   Collection Time: 03/26/20  4:32 PM  Result Value Ref Range   Glucose-Capillary 70 70 - 99 mg/dL  Glucose, capillary     Status: Abnormal   Collection Time: 03/26/20  7:53 PM  Result Value Ref Range   Glucose-Capillary 67 (L) 70 - 99 mg/dL  Comprehensive metabolic panel     Status: Abnormal   Collection Time: 03/26/20  8:09 PM  Result Value Ref Range   Sodium 128 (L) 135 - 145 mmol/L   Potassium 3.5 3.5 - 5.1 mmol/L   Chloride 91 (L) 98 - 111 mmol/L   CO2 27 22 - 32 mmol/L   Glucose, Bld 82 70 - 99 mg/dL   BUN 42 (H) 8 - 23 mg/dL   Creatinine, Ser 4.62 (H) 0.61 - 1.24 mg/dL   Calcium 5.9 (LL) 8.9 - 10.3 mg/dL   Total Protein 3.6 (L) 6.5 - 8.1 g/dL   Albumin 2.0 (L) 3.5 - 5.0 g/dL   AST 703 (H) 15 - 41 U/L   ALT 120 (H) 0 - 44 U/L   Alkaline Phosphatase 248 (H) 38 - 126 U/L   Total Bilirubin 13.3 (H) 0.3 - 1.2 mg/dL   GFR calc non Af Amer >60 >60 mL/min   GFR calc Af Amer >60 >60 mL/min   Anion gap 10 5 - 15  Glucose, capillary     Status: None   Collection Time: 03/26/20  8:31 PM  Result Value Ref Range   Glucose-Capillary 91 70 - 99 mg/dL   Comment 1 Notify RN    Comment 2 Document in Chart   Glucose, capillary     Status: Abnormal   Collection Time: 03/26/20 11:48 PM  Result Value Ref Range   Glucose-Capillary 116 (H) 70 - 99 mg/dL   Comment 1 Notify RN    Comment 2 Document in Chart   Lactic acid, plasma     Status: Abnormal   Collection Time: 03/27/20  12:46 AM  Result Value Ref Range   Lactic Acid, Venous 4.7 (HH) 0.5 - 1.9 mmol/L  Glucose, capillary     Status: Abnormal   Collection Time: 03/27/20  3:49 AM  Result Value Ref Range   Glucose-Capillary 114 (H) 70 - 99 mg/dL   Comment 1 Notify RN    Comment 2 Document in Chart  Protime-INR     Status: Abnormal   Collection Time: 03/27/20  5:09 AM  Result Value Ref Range   Prothrombin Time 24.6 (H) 11.4 - 15.2 seconds   INR 2.3 (H) 0.8 - 1.2  CBC     Status: Abnormal   Collection Time: 03/27/20  5:09 AM  Result Value Ref Range   WBC 2.3 (L) 4.0 - 10.5 K/uL   RBC 2.05 (L) 4.22 - 5.81 MIL/uL   Hemoglobin 6.3 (LL) 13.0 - 17.0 g/dL   HCT 31.5 (L) 39 - 52 %   MCV 87.8 80.0 - 100.0 fL   MCH 30.7 26.0 - 34.0 pg   MCHC 35.0 30.0 - 36.0 g/dL   RDW 40.0 (H) 86.7 - 61.9 %   Platelets 48 (L) 150 - 400 K/uL   nRBC 0.9 (H) 0.0 - 0.2 %  Magnesium     Status: Abnormal   Collection Time: 03/27/20  5:09 AM  Result Value Ref Range   Magnesium 2.5 (H) 1.7 - 2.4 mg/dL  Phosphorus     Status: None   Collection Time: 03/27/20  5:09 AM  Result Value Ref Range   Phosphorus 3.2 2.5 - 4.6 mg/dL  Comprehensive metabolic panel     Status: Abnormal   Collection Time: 03/27/20  5:09 AM  Result Value Ref Range   Sodium 128 (L) 135 - 145 mmol/L   Potassium 3.8 3.5 - 5.1 mmol/L   Chloride 92 (L) 98 - 111 mmol/L   CO2 22 22 - 32 mmol/L   Glucose, Bld 121 (H) 70 - 99 mg/dL   BUN 41 (H) 8 - 23 mg/dL   Creatinine, Ser 5.09 (H) 0.61 - 1.24 mg/dL   Calcium 6.2 (LL) 8.9 - 10.3 mg/dL   Total Protein 3.2 (L) 6.5 - 8.1 g/dL   Albumin 1.6 (L) 3.5 - 5.0 g/dL   AST 326 (H) 15 - 41 U/L   ALT 108 (H) 0 - 44 U/L   Alkaline Phosphatase 219 (H) 38 - 126 U/L   Total Bilirubin 12.1 (H) 0.3 - 1.2 mg/dL   GFR calc non Af Amer 43 (L) >60 mL/min   GFR calc Af Amer 50 (L) >60 mL/min   Anion gap 14 5 - 15  Lactic acid, plasma     Status: Abnormal   Collection Time: 03/27/20  6:26 AM  Result Value Ref Range   Lactic  Acid, Venous 6.6 (HH) 0.5 - 1.9 mmol/L  Prepare RBC (crossmatch)     Status: None   Collection Time: 03/27/20  7:32 AM  Result Value Ref Range   Order Confirmation      ORDER PROCESSED BY BLOOD BANK Performed at [facility], [address], [place], [place] [address]   Glucose, capillary     Status: Abnormal   Collection Time: 03/27/20  7:44 AM  Result Value Ref Range   Glucose-Capillary 107 (H) 70 - 99 mg/dL  Occult blood card to lab, stool     Status: Abnormal   Collection Time: 03/27/20 11:28 AM  Result Value Ref Range   Fecal Occult Bld POSITIVE (A) NEGATIVE  Prepare fresh frozen plasma     Status: None (Preliminary result)   Collection Time: 03/27/20 11:36 AM  Result Value Ref Range   Unit Number [id number]    Blood Component Type THAWED PLASMA    Unit division 00    Status of Unit ISSUED    Transfusion Status OK TO TRANSFUSE    Unit Number  [id number]    Blood Component Type THAWED PLASMA    Unit division 00    Status of Unit ISSUED    Transfusion Status      OK TO TRANSFUSE Performed at [facility], [address] [place], [place] [address]   Glucose, capillary     Status: Abnormal   Collection Time: 03/27/20 12:34 PM  Result Value Ref Range   Glucose-Capillary 126 (H) 70 - 99 mg/dL  Prepare Pheresed Platelets     Status: None (Preliminary result)   Collection Time: 03/27/20  1:30 PM  Result Value Ref Range   Unit Number [id number]    Blood Component Type PLTP2 PSORALEN TREATED    Unit division 00    Status of Unit ISSUED    Transfusion Status      OK TO TRANSFUSE Performed at [facility], [address] [place], [place] [address]    Recent Results (from the past 240 hour(s))  Culture, blood (routine x 2)     Status: None (Preliminary result)   Collection Time: 04/03/2020 11:00 PM   Specimen: BLOOD  Result Value Ref Range Status   Specimen Description   Final    BLOOD RIGHT  ANTECUBITAL Performed at [facility], [address] [place], [place] [address]    Special Requests   Final    BOTTLES DRAWN AEROBIC AND ANAEROBIC Blood Culture adequate volume Performed at [facility], [address] [place], [place] [address]    Culture   Final    NO GROWTH 2 DAYS Performed at [facility], [address] [place], [place] [address]    Report Status PENDING  Incomplete  Culture, blood (routine x 2)     Status: None (Preliminary result)   Collection Time: 03/25/20 12:00 AM   Specimen: BLOOD  Result Value Ref Range Status   Specimen Description   Final    BLOOD RIGHT ANTECUBITAL Performed at [facility], [address] [place], [place] [address]    Special Requests   Final    BOTTLES DRAWN AEROBIC AND ANAEROBIC Blood Culture adequate volume Performed at [facility], [address] [place], [place] [address]    Culture   Final    NO GROWTH 2 DAYS Performed at [facility], [address] [place], [place] [address]    Report Status PENDING  Incomplete  Urine culture     Status: None   Collection Time: 03/25/20  5:06 AM   Specimen: In/Out Cath Urine  Result Value Ref Range Status   Specimen Description   Final    IN/OUT CATH URINE Performed at [facility], [address] [place], [place] [address]    Special Requests   Final    NONE Performed at [facility], [address] [place], [place] [address]    Culture   Final    NO GROWTH Performed at [facility], [address] [place], [place] [address]    Report Status 03/26/2020 FINAL  Final  SARS Coronavirus 2 by RT PCR (hospital order, performed in [facility] hospital lab) Nasopharyngeal Urine, Catheterized     Status: None   Collection Time: 03/25/20  6:16 AM   Specimen: Urine, Catheterized; Nasopharyngeal  Result Value Ref Range Status   SARS Coronavirus 2  NEGATIVE NEGATIVE Final    Comment: (NOTE) SARS-CoV-2 target nucleic acids are NOT DETECTED.  The SARS-CoV-2  RNA is generally detectable in upper and lower respiratory specimens during the acute phase of infection. The lowest concentration of SARS-CoV-2 viral copies this assay can detect is 250 copies / mL. A negative result does not preclude SARS-CoV-2 infection and should not be used as the sole basis for treatment or other patient management decisions.  A negative result may occur with improper specimen collection / handling, submission of specimen other than nasopharyngeal swab, presence of viral mutation(s) within the areas targeted by this assay, and inadequate number of viral copies (<250 copies / mL). A negative result must be combined with clinical observations, patient history, and epidemiological information.  Fact Sheet for Patients:   BoilerBrush.com.cy  Fact Sheet for Healthcare Providers: https://pope.com/  This test is not yet approved or  cleared by the [place] FDA and has been authorized for detection and/or diagnosis of SARS-CoV-2 by FDA under an Emergency Use Authorization (EUA).  This EUA will remain in effect (meaning this test can be used) for the duration of the COVID-19 declaration under Section 564(b)(1) of the Act, 21 U.S.C. section 360bbb-3(b)(1), unless the authorization is terminated or revoked sooner.  Performed at [facility], [address] [place], [place] [address]   MRSA PCR Screening     Status: None   Collection Time: 03/25/20  8:53 AM   Specimen: Nasal Mucosa; Nasopharyngeal  Result Value Ref Range Status   MRSA by PCR NEGATIVE NEGATIVE Final    Comment:        The GeneXpert MRSA Assay (FDA approved for NASAL specimens only), is one component of a comprehensive MRSA colonization surveillance program. It is not intended to diagnose MRSA infection nor to guide  or monitor treatment for MRSA infections. Performed at [facility], [address] [place], [place] [address]   Body fluid culture (includes gram stain)     Status: None (Preliminary result)   Collection Time: 03/25/20  1:01 PM   Specimen: Peritoneal Washings; Peritoneal Fluid  Result Value Ref Range Status   Specimen Description   Final    PERITONEAL Performed at [facility], [address] [place], [place] [address]    Special Requests   Final    NONE Performed at [facility], [address] [place], [place] [address]    Gram Stain   Final    WBC PRESENT,BOTH PMN AND MONONUCLEAR NO ORGANISMS SEEN CYTOSPIN SMEAR    Culture   Final    NO GROWTH 2 DAYS Performed at [facility], [address] [place], [place] [address]    Report Status PENDING  Incomplete    Renal Function: Recent Labs    03/22/2020 2300 03/25/20 0214 03/25/20 0619 03/25/20 1700 03/26/20 0414 03/26/20 2009 03/27/20 0509  CREATININE 1.56* 1.86* 1.67* 1.28* 1.28* 1.26* 1.67*   Estimated Creatinine Clearance: 44.1 mL/min (A) (by C-G formula based on SCr of 1.67 mg/dL (H)).  Radiologic Imaging: NM Hepatobiliary Liver Func  Result Date: 03/26/2020 CLINICAL DATA:  Chronic upper abdominal pain. Jaundice with elevated liver function studies. EXAM: NUCLEAR MEDICINE HEPATOBILIARY IMAGING TECHNIQUE: Sequential images of the abdomen were obtained out to 60 minutes following intravenous administration of radiopharmaceutical. RADIOPHARMACEUTICALS:  5.3 mCi Tc-106m  Choletec IV COMPARISON:  Ultrasound and CT 03/25/2020. FINDINGS: Mildly delayed hepatic uptake with limited biliary excretion. Gallbladder activity is visualized, consistent with patency of cystic duct. No passage of biliary activity into the small bowel is identified, likely related to hepatic dysfunction. IMPRESSION:  1. The cystic duct is patent with filling of the gallbladder lumen. 2.  Delayed hepatic uptake and limited biliary excretion with no passage of activity into the small bowel, likely related to hepatic dysfunction. No biliary dilatation on recent imaging. Electronically Signed   By: Carey Bullocks M.D.   On: 03/26/2020 12:42   US RENAL  Result Date: 03/27/2020 CLINICAL DATA:  Renal failure. EXAM: RENAL / URINARY TRACT ULTRASOUND COMPLETE COMPARISON:  CT of the abdomen and pelvis on 03/25/2020 FINDINGS: Right Kidney: Renal measurements: 11.1 x 4.5 x 4.4 centimeters = volume: 114.5 mL . Echogenicity within normal limits. No mass or hydronephrosis visualized. Left Kidney: Renal measurements: 10.6 x 5.3 x 4.8 centimeters = volume: 140.1 mL. Echogenicity within normal limits. No mass or hydronephrosis visualized. Bladder: Heterogeneous material identified within the urinary bladder, consistent with blood clot. A Foley catheter is not identified within the lumen of the urinary bladder. Parallel hyperechoic linear structures are along the posterior aspect of the bladder, raising the question of malposition of the Foley catheter. Other: Ascites. IMPRESSION: 1. No hydronephrosis or renal mass. 2. Large amount of heterogeneous material within the urinary bladder, consistent with blood clot. 3. Foley catheter is not identified within the urinary bladder. There are linear hyperechoic structures along the posterior aspect of the bladder, raising the question of malposition of the Foley catheter. 4. Ascites. Critical Value/emergent results were called by telephone at the time of interpretation on 03/27/2020 at 1:05 pm to provider Dr. Merry Lofty, who verbally acknowledged these results. Electronically Signed   By: Norva Pavlov M.D.   On: 03/27/2020 13:05    I independently reviewed the above imaging studies.  Procedure: I removed existing foley catheter and prepped and draped patient in usual sterile fashion. Lidocaine jelly was placed per urethra. I then advanced a 22Fr three way  hematuria catheter per urethra which passed easily. There was no initial return of urine, so subsequently flushed in 50cc of sterile saline, which returned with clear yellow urine. Instilled 10cc of sterile water in the catheter balloon. Continued to irrigate further, with persistent return of only clear yellow urine without clots. There was minimal return of urine after initial placement, about 50cc. The 3rd port of the hematuria catheter was left plugged. The patient tolerated the procedure well.   Impression/Recommendation  64 y.o. male with alcoholic hepatitis, ascites and anemia in the setting of shock. His bladder ultrasound showed possible blood products in the bladder, but on exchanging of his catheter, it flushes very easily with return of clear yellow urine, so it seems quite unlikely that he has large blood products in his urine. I am concerned that he may have an extravesical hematoma instead, leading to his tender suprapubic region and recent anemia. Likely recent low urine output is more related to shock/AKI rather than catheter obstruction.  Would consider CT A/P to evaluate for possible pelvic/abdominal hematoma/fluid collection.  If no evidence of intravesical pathology on CT scan, would consider downsizing foley catheter to 18Fr for patient comfort.    Thea Alken 03/27/2020, 3:01 PM

## 2020-03-27 NOTE — Progress Notes (Signed)
eLink Physician-Brief Progress Note Patient Name: GREGG WINCHELL DOB: 02-22-1956 MRN: [id number]   Date of Service  03/27/2020  HPI/Events of Note  Request for order of IR angiogram visceral Patient still actively bleeding with continued blood products being given  eICU Interventions  Order entered IR getting consent from family     Intervention Category Minor Interventions: Other:  Darl Pikes 03/27/2020, 8:22 PM

## 2020-03-27 NOTE — Consult Note (Signed)
Chief Complaint: Patient was seen in consultation today for pelvic hematoma and active bleeding  Referring Physician(s): Karie Fetch, DO   Patient Status: [facility] - In-pt  History of Present Illness: Cory Aguirre is a 64 y.o. male with recent admission for hypotension, altered mental status and acute alcoholic hepatitis. Patient has undergone 2 paracentesis procedures.  Patient had decreased urine output and US demonstrated a complex pelvic fluid collection.  Evaluated by Urology and foley appears to be well positioned and no significant blood in bladder.  CT demonstrates a large hematoma in the anterior pelvis between the abdominal wall and the bladder (? Space of [place]) with areas of active contrast extravasation.  Patient is currently on pressors and been receiving blood products throughout the day.  He is confused and not following commands well.  Sister is POA.  Allergies: Patient has no known allergies.  Medications: Prior to Admission medications   Medication Sig Start Date End Date Taking? Authorizing Provider  pantoprazole (PROTONIX) 20 MG tablet Take 1 tablet (20 mg total) by mouth daily. Patient not taking: Reported on 02-Apr-2020 11/16/15   Gilda Crease, MD     History reviewed. No pertinent family history.  Social History   Socioeconomic History   Marital status: Single    Spouse name: Not on file   Number of children: Not on file   Years of education: Not on file   Highest education level: Not on file  Occupational History   Not on file  Tobacco Use   Smoking status: Never Smoker   Smokeless tobacco: Current User  Substance and Sexual Activity   Alcohol use: Yes    Comment: 3-4 times a week    Drug use: No   Sexual activity: Not on file  Other Topics Concern   Not on file  Social History Narrative   Not on file   Social Determinants of Health   Financial Resource Strain:    Difficulty of Paying Living Expenses:   Food  Insecurity:    Worried About Programme researcher, broadcasting/film/video in the Last Year:    Barista in the Last Year:   Transportation Needs:    Freight forwarder (Medical):    Lack of Transportation (Non-Medical):   Physical Activity:    Days of Exercise per Week:    Minutes of Exercise per Session:   Stress:    Feeling of Stress :   Social Connections:    Frequency of Communication with Friends and Family:    Frequency of Social Gatherings with Friends and Family:    Attends Religious Services:    Active Member of Clubs or Organizations:    Attends Engineer, structural:    Marital Status:     Review of Systems  Vital Signs: BP (!) 106/58    Pulse (!) 101    Temp (!) 97.5 F (36.4 C) (Oral)    Resp (!) 29    Ht  (1.753 m)    Wt 68.8 kg    SpO2 97%    BMI 22.40 kg/m   Physical Exam Constitutional:      Comments: Jaundice and confused  Cardiovascular:     Rate and Rhythm: Tachycardia present.  Abdominal:     General: There is distension.     Comments: Focal distention in anterior lower abdomen/pelvis - consistent with hematoma.  Skin:    Comments: Marked bruising left flank region.     Imaging: DG Chest 1  View  Result Date: 03/25/2020 CLINICAL DATA:  Central line placement EXAM: CHEST  1 VIEW COMPARISON:  Same day chest radiograph FINDINGS: Interval placement of left subclavian approach central venous catheter with distal tip terminating at the level of the superior cavoatrial junction. Stable heart size. Atherosclerotic calcification of the aortic knob. Low lung volumes with hazy bibasilar opacities. Trace bilateral pleural effusions not excluded. No pneumothorax. IMPRESSION: 1. Interval placement of left subclavian approach central venous catheter with distal tip terminating at the level of the superior cavoatrial junction. No pneumothorax. 2. New hazy bibasilar opacities, which may reflect atelectasis accentuated by low lung volumes. Mild edema and/or  small bilateral pleural effusions could also have a similar appearance. Electronically Signed   By: Duanne Guess D.O.   On: 03/25/2020 13:14   CT Head Wo Contrast  Result Date: 03/25/2020 CLINICAL DATA:  Mental status change EXAM: CT HEAD WITHOUT CONTRAST TECHNIQUE: Contiguous axial images were obtained from the base of the skull through the vertex without intravenous contrast. COMPARISON:  None. FINDINGS: Brain: No acute territorial infarction, hemorrhage, or intracranial mass. Mild atrophy. Mild hypodensity in the white matter consistent with chronic small vessel ischemic change. Nonenlarged ventricles. Vascular: No hyperdense vessels.  Carotid vascular calcification Skull: Normal. Negative for fracture or focal lesion. Sinuses/Orbits: No acute finding. Other: None IMPRESSION: 1. No CT evidence for acute intracranial abnormality. 2. Atrophy and mild chronic small vessel ischemic changes of the white matter. Electronically Signed   By: Jasmine Pang M.D.   On: 03/25/2020 02:56   NM Hepatobiliary Liver Func  Result Date: 03/26/2020 CLINICAL DATA:  Chronic upper abdominal pain. Jaundice with elevated liver function studies. EXAM: NUCLEAR MEDICINE HEPATOBILIARY IMAGING TECHNIQUE: Sequential images of the abdomen were obtained out to 60 minutes following intravenous administration of radiopharmaceutical. RADIOPHARMACEUTICALS:  5.3 mCi Tc-41m  Choletec IV COMPARISON:  Ultrasound and CT 03/25/2020. FINDINGS: Mildly delayed hepatic uptake with limited biliary excretion. Gallbladder activity is visualized, consistent with patency of cystic duct. No passage of biliary activity into the small bowel is identified, likely related to hepatic dysfunction. IMPRESSION: 1. The cystic duct is patent with filling of the gallbladder lumen. 2. Delayed hepatic uptake and limited biliary excretion with no passage of activity into the small bowel, likely related to hepatic dysfunction. No biliary dilatation on recent imaging.  Electronically Signed   By: Carey Bullocks M.D.   On: 03/26/2020 12:42   CT ABDOMEN PELVIS W CONTRAST  Result Date: 03/27/2020 CLINICAL DATA:  Concern for bleeding into bladder by prior ultrasound EXAM: CT ABDOMEN AND PELVIS WITH CONTRAST TECHNIQUE: Multidetector CT imaging of the abdomen and pelvis was performed using the standard protocol following bolus administration of intravenous contrast. CONTRAST:  20mL OMNIPAQUE IOHEXOL 300 MG/ML  SOLN COMPARISON:  03/25/2020 CT.  Renal ultrasound 03/27/2020 FINDINGS: Lower chest: Moderate to large bilateral pleural effusions, increasing since prior study. Compressive atelectasis in the lower lobes. Hepatobiliary: Suggestion of nodularity to the surface of the liver again noted. No focal hepatic abnormality. High-density material seen within the gallbladder could reflect vicarious excretion of contrast, similar prior study. Pancreas: No focal abnormality or ductal dilatation. Spleen: Scattered low-density lesions within the spleen. The largest noted medially measuring 3.2 cm. Spleen is enlarged measuring 13.9 cm. Adrenals/Urinary Tract: No renal or adrenal mass. No hydronephrosis. Delayed imaging through the kidneys demonstrates no significant excretion of contrast into the collecting systems bilaterally. In what appears to be the bladder within the pelvis, there are layering hematocrit levels and fluid levels. This  is felt to most likely be blood within the urinary bladder. On the sagittal view, it appears that the Foley catheter is posterior to what is felt represent the bladder. Conceivably, this could be a complex fluid collection/hematoma anterior to a decompressed urinary bladder, but this is difficult to confirm. Stomach/Bowel: Stomach, large and small bowel grossly unremarkable. Vascular/Lymphatic: Aortic atherosclerosis. No enlarged abdominal or pelvic lymph nodes. Reproductive: No visible focal abnormality. Other: Moderate ascites throughout the abdomen and  pelvis. Musculoskeletal: No acute bony abnormality. IMPRESSION: Rounded area with hematocrit/fluid levels in the pelvis felt to most likely be the urinary bladder. It appears that the indwelling Foley catheter is posterior to this fluid collection. This conceivably could reflect a complex fluid collection/hematoma anterior to a decompressed urinary bladder. 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 Areas of low-density within the spleen, nonspecific and stable since prior study. Delayed excretion of contrast from the kidneys into the collecting systems bilaterally. No hydronephrosis. Electronically Signed   By: Charlett Nose M.D.   On: 03/27/2020 17:51   CT ABDOMEN PELVIS W CONTRAST  Result Date: 03/25/2020 CLINICAL DATA:  Jaundice EXAM: CT ABDOMEN AND PELVIS WITH CONTRAST TECHNIQUE: Multidetector CT imaging of the abdomen and pelvis was performed using the standard protocol following bolus administration of intravenous contrast. CONTRAST:  12mL OMNIPAQUE IOHEXOL 300 MG/ML  SOLN COMPARISON:  None. FINDINGS: Lower chest: Lung bases demonstrate small bilateral pleural effusions. Cardiac size within normal limits. Hepatobiliary: No focal hepatic abnormality. Hyperdense sludge or vicarious contrast excretion within the gallbladder. Possible gallbladder wall thickening or edema. Possible contour nodularity of the liver. Pancreas: Unremarkable. No pancreatic ductal dilatation or surrounding inflammatory changes. Spleen: Enlarged up to 15 cm. Adrenals/Urinary Tract: Adrenal glands are normal. Kidneys show no hydronephrosis. The urinary bladder is unremarkable. Stomach/Bowel: Stomach nonenlarged. Thickened slightly dilated appearing jejunal small bowel loops in the left upper quadrant. Generalized fluid-filled small bowel without well-defined transition. Mild wall thickening of the hepatic flexure and ascending colon. Negative appendix. Vascular/Lymphatic: Moderate aortic atherosclerosis. No aneurysm.  No suspicious nodes Reproductive: Prostate is unremarkable.  Prostate calcifications. Other: No free air. Moderate volume of ascites within the abdomen and pelvis Musculoskeletal: No acute or suspicious osseous abnormality. IMPRESSION: 1. Small right greater than left pleural effusions. 2. Suspected liver disease. There is splenomegaly and moderate volume of ascites within the abdomen and pelvis. 3. Diffuse fluid within the small bowel. Mild thickened appearance of left upper quadrant jejunal small bowel loops with mild wall thickening of the ascending colon and hepatic flexure. Findings could be secondary to enteritis/colitis versus portal enteropathy. 4. Hyperdense sludge within the gallbladder. Possible gallbladder wall thickening or edema, correlation with right upper quadrant ultrasound could be obtained as indicated Aortic Atherosclerosis (ICD10-I70.0). Electronically Signed   By: Jasmine Pang M.D.   On: 03/25/2020 03:35   US RENAL  Result Date: 03/27/2020 CLINICAL DATA:  Renal failure. EXAM: RENAL / URINARY TRACT ULTRASOUND COMPLETE COMPARISON:  CT of the abdomen and pelvis on 03/25/2020 FINDINGS: Right Kidney: Renal measurements: 11.1 x 4.5 x 4.4 centimeters = volume: 114.5 mL . Echogenicity within normal limits. No mass or hydronephrosis visualized. Left Kidney: Renal measurements: 10.6 x 5.3 x 4.8 centimeters = volume: 140.1 mL. Echogenicity within normal limits. No mass or hydronephrosis visualized. Bladder: Heterogeneous material identified within the urinary bladder, consistent with blood clot. A Foley catheter is not identified within the lumen of the urinary bladder. Parallel hyperechoic linear structures are along the posterior aspect of the bladder, raising the question of  malposition of the Foley catheter. Other: Ascites. IMPRESSION: 1. No hydronephrosis or renal mass. 2. Large amount of heterogeneous material within the urinary bladder, consistent with blood clot. 3. Foley catheter is not  identified within the urinary bladder. There are linear hyperechoic structures along the posterior aspect of the bladder, raising the question of malposition of the Foley catheter. 4. Ascites. Critical Value/emergent results were called by telephone at the time of interpretation on 03/27/2020 at 1:05 pm to provider Dr. Merry LoftyLaura Paige Clark, who verbally acknowledged these results. Electronically Signed   By: Norva PavlovElizabeth  Brown M.D.   On: 03/27/2020 13:05   DG Chest Portable 1 View  Result Date: 03/25/2020 CLINICAL DATA:  Sepsis EXAM: PORTABLE CHEST 1 VIEW COMPARISON:  11/16/2015 FINDINGS: The heart size and mediastinal contours are within normal limits. Both lungs are clear. The visualized skeletal structures are unremarkable. Calcification of the aorta. IMPRESSION: No active disease. Electronically Signed   By: Burman NievesWilliam  Stevens M.D.   On: 03/25/2020 01:07   US Abdomen Limited RUQ  Result Date: 03/25/2020 CLINICAL DATA:  Elevated liver function studies.  Severe jaundice. EXAM: ULTRASOUND ABDOMEN LIMITED RIGHT UPPER QUADRANT COMPARISON:  CT 03/25/2020 FINDINGS: Gallbladder: Gallbladder is filled with sludge with thickened wall and pericholecystic edema. Murphy's sign is positive. No discrete stones are identified. Common bile duct: Diameter: 5 mm, normal Liver: Heterogeneous liver parenchymal echotexture suggesting cirrhosis or fatty infiltration. No focal lesions identified. Portal vein is patent on color Doppler imaging with normal direction of blood flow towards the liver. Other: Upper abdominal ascites is present. IMPRESSION: 1. Gallbladder is filled with sludge, thickened wall, and pericholecystic edema. Positive Murphy's sign. Changes may represent acute cholecystitis in the appropriate clinical setting. 2. Heterogeneous liver parenchymal echotexture suggesting cirrhosis or fatty infiltration. 3. Upper abdominal ascites. Electronically Signed   By: Burman NievesWilliam  Stevens M.D.   On: 03/25/2020 05:49     Labs:  CBC: Recent Labs    03/25/20 1640 03/25/20 1640 03/25/20 2101 03/26/20 0831 03/27/20 0509 03/27/20 1600  WBC 2.4*  --   --  1.2* 2.3* 2.7*  HGB 8.5*  --   --  6.8* 6.3* 5.8*  HCT 24.0*  --   --  19.4* 18.0* 16.5*  PLT 56*   < > 51* 38* 48* 70*   < > = values in this interval not displayed.    COAGS: Recent Labs    03/25/20 2101 03/26/20 0414 03/27/20 0509 03/27/20 1600  INR 2.1* 1.9* 2.3* 1.7*  APTT 70*  --   --   --     BMP: Recent Labs    03/25/20 1700 03/26/20 0414 03/26/20 2009 03/27/20 0509  NA 122* 127* 128* 128*  K 3.7 3.4* 3.5 3.8  CL 91* 91* 91* 92*  CO2 22 26 27 22   GLUCOSE 117* 156* 82 121*  BUN 44* 42* 42* 41*  CALCIUM 5.5* 6.0* 5.9* 6.2*  CREATININE 1.28* 1.28* 1.26* 1.67*  GFRNONAA 59* 59* >60 43*  GFRAA >60 >60 >60 50*    LIVER FUNCTION TESTS: Recent Labs    03/25/20 1700 03/26/20 0414 03/26/20 2009 03/27/20 0509  BILITOT 10.5* 11.0* 13.3* 12.1*  AST 643* 542* 609* 490*  ALT 120* 106* 120* 108*  ALKPHOS 300* 232* 248* 219*  PROT 3.3* 3.5* 3.6* 3.2*  ALBUMIN 1.7* 2.0* 2.0* 1.6*    TUMOR MARKERS: No results for input(s): AFPTM, CEA, CA199, CHROMGRNA in the last 8760 hours.  Assessment and Plan:  64 yo with acute alcoholic hepatitis, septic shock  and active bleeding from a large pelvic hematoma.  Bleeding is likely coming inferior epigastric artery distribution and suspect the main source of bleeding is on left side.  Discussed pelvic arteriography and embolization with patient's sister and POA.  Discussed benefits and risks of the procedure.  In particular, we discussed the risk of contrast induced nephrotoxicity with his already compromised renal function.  No good surgical options at this time and transcatheter embolization is minimally invasive and best option for success at this time.  Informed consent obtained from the patient's sister.      Thank you for this interesting consult.  I greatly enjoyed meeting Cory Aguirre and look forward to participating in their care.  A copy of this report was sent to the requesting provider on this date.  Electronically Signed: Arn Medal, MD 03/27/2020, 8:40 PM   \

## 2020-03-27 NOTE — Plan of Care (Addendum)
CT scan reviewed.  Large bilateral pleural effusions.  Significant fluid surrounding the liver and spleen, complex layering structure in the pelvis, likely anterior and superior to a decompressed bladder with Foley catheter present.  Discussed with IR- he likely has active bleeding which could be amenable to intervention.  Will plan for angiography this evening.  Last INR 1.7, has additional FFP, platelets, RBCs, cryo ordered.  Sister Jacki Cones updated via phone. His sister [name] [phone]) is POA.  Steffanie Dunn, DO 03/27/20 7:14 PM [facility]

## 2020-03-27 NOTE — Progress Notes (Signed)
eLink Physician-Brief Progress Note Patient Name: Cory Aguirre DOB: 08/11/1956 MRN: [id number]   Date of Service  03/27/2020  HPI/Events of Note  Lactic acid level 4.7  eICU Interventions  LR 500 ml iv fluid bolus given x 1        Sakia Schrimpf U Maeven Mcdougall 03/27/2020, 2:41 AM

## 2020-03-28 ENCOUNTER — Inpatient Hospital Stay (HOSPITAL_COMMUNITY): Payer: BLUE CROSS/BLUE SHIELD

## 2020-03-28 DIAGNOSIS — K7010 Alcoholic hepatitis without ascites: Secondary | ICD-10-CM

## 2020-03-28 DIAGNOSIS — K7460 Unspecified cirrhosis of liver: Secondary | ICD-10-CM

## 2020-03-28 DIAGNOSIS — R34 Anuria and oliguria: Secondary | ICD-10-CM

## 2020-03-28 DIAGNOSIS — K7290 Hepatic failure, unspecified without coma: Secondary | ICD-10-CM | POA: Diagnosis not present

## 2020-03-28 DIAGNOSIS — F1010 Alcohol abuse, uncomplicated: Secondary | ICD-10-CM

## 2020-03-28 DIAGNOSIS — N179 Acute kidney failure, unspecified: Secondary | ICD-10-CM

## 2020-03-28 DIAGNOSIS — R578 Other shock: Secondary | ICD-10-CM

## 2020-03-28 DIAGNOSIS — D62 Acute posthemorrhagic anemia: Secondary | ICD-10-CM

## 2020-03-28 DIAGNOSIS — D689 Coagulation defect, unspecified: Secondary | ICD-10-CM | POA: Diagnosis not present

## 2020-03-28 DIAGNOSIS — G9341 Metabolic encephalopathy: Secondary | ICD-10-CM | POA: Diagnosis not present

## 2020-03-28 LAB — URINALYSIS, ROUTINE W REFLEX MICROSCOPIC
Bacteria, UA: NONE SEEN
Bilirubin Urine: NEGATIVE
Glucose, UA: 50 mg/dL — AB
Ketones, ur: NEGATIVE mg/dL
Leukocytes,Ua: NEGATIVE
Nitrite: NEGATIVE
Protein, ur: 100 mg/dL — AB
RBC / HPF: >50 RBC/hpf — ABNORMAL HIGH (ref 0–5)
Specific Gravity, Urine: 1.039 — ABNORMAL HIGH (ref 1.005–1.030)
pH: 5 (ref 5.0–8.0)

## 2020-03-28 LAB — CBC WITH DIFFERENTIAL/PLATELET
Abs Immature Granulocytes: 0.03 10*3/uL (ref 0.00–0.07)
Basophils Absolute: 0 10*3/uL (ref 0.0–0.1)
Basophils Relative: 0 %
Eosinophils Absolute: 0 10*3/uL (ref 0.0–0.5)
Eosinophils Relative: 0 %
HCT: 18.1 % — ABNORMAL LOW (ref 39.0–52.0)
Hemoglobin: 6.4 g/dL — CL (ref 13.0–17.0)
Immature Granulocytes: 2 %
Lymphocytes Relative: 12 %
Lymphs Abs: 0.2 10*3/uL — ABNORMAL LOW (ref 0.7–4.0)
MCH: 30 pg (ref 26.0–34.0)
MCHC: 35.4 g/dL (ref 30.0–36.0)
MCV: 85 fL (ref 80.0–100.0)
Monocytes Absolute: 0.2 10*3/uL (ref 0.1–1.0)
Monocytes Relative: 10 %
Neutro Abs: 1.3 10*3/uL — ABNORMAL LOW (ref 1.7–7.7)
Neutrophils Relative %: 76 %
Platelets: 43 10*3/uL — ABNORMAL LOW (ref 150–400)
RBC: 2.13 MIL/uL — ABNORMAL LOW (ref 4.22–5.81)
RDW: 15.9 % — ABNORMAL HIGH (ref 11.5–15.5)
WBC: 1.7 10*3/uL — ABNORMAL LOW (ref 4.0–10.5)
nRBC: 0 % (ref 0.0–0.2)

## 2020-03-28 LAB — LACTIC ACID, PLASMA
Lactic Acid, Venous: 3.3 mmol/L (ref 0.5–1.9)
Lactic Acid, Venous: 4.5 mmol/L (ref 0.5–1.9)
Lactic Acid, Venous: 6 mmol/L (ref 0.5–1.9)

## 2020-03-28 LAB — BASIC METABOLIC PANEL
Anion gap: 13 (ref 5–15)
BUN: 52 mg/dL — ABNORMAL HIGH (ref 8–23)
CO2: 25 mmol/L (ref 22–32)
Calcium: 6.7 mg/dL — ABNORMAL LOW (ref 8.9–10.3)
Chloride: 95 mmol/L — ABNORMAL LOW (ref 98–111)
Creatinine, Ser: 2.23 mg/dL — ABNORMAL HIGH (ref 0.61–1.24)
GFR calc Af Amer: 35 mL/min — ABNORMAL LOW (ref >60)
GFR calc non Af Amer: 30 mL/min — ABNORMAL LOW (ref >60)
Glucose, Bld: 132 mg/dL — ABNORMAL HIGH (ref 70–99)
Potassium: 3.6 mmol/L (ref 3.5–5.1)
Sodium: 133 mmol/L — ABNORMAL LOW (ref 135–145)

## 2020-03-28 LAB — EPSTEIN-BARR VIRUS VCA, IGM: EBV VCA IgM: <36 U/mL (ref 0.0–35.9)

## 2020-03-28 LAB — COMPREHENSIVE METABOLIC PANEL
ALT: 80 U/L — ABNORMAL HIGH (ref 0–44)
AST: 290 U/L — ABNORMAL HIGH (ref 15–41)
Albumin: 1.9 g/dL — ABNORMAL LOW (ref 3.5–5.0)
Alkaline Phosphatase: 144 U/L — ABNORMAL HIGH (ref 38–126)
Anion gap: 13 (ref 5–15)
BUN: 47 mg/dL — ABNORMAL HIGH (ref 8–23)
CO2: 23 mmol/L (ref 22–32)
Calcium: 6.5 mg/dL — ABNORMAL LOW (ref 8.9–10.3)
Chloride: 91 mmol/L — ABNORMAL LOW (ref 98–111)
Creatinine, Ser: 2.01 mg/dL — ABNORMAL HIGH (ref 0.61–1.24)
GFR calc Af Amer: 40 mL/min — ABNORMAL LOW (ref >60)
GFR calc non Af Amer: 34 mL/min — ABNORMAL LOW (ref >60)
Glucose, Bld: 180 mg/dL — ABNORMAL HIGH (ref 70–99)
Potassium: 3.7 mmol/L (ref 3.5–5.1)
Sodium: 127 mmol/L — ABNORMAL LOW (ref 135–145)
Total Bilirubin: 13.8 mg/dL — ABNORMAL HIGH (ref 0.3–1.2)
Total Protein: 3.6 g/dL — ABNORMAL LOW (ref 6.5–8.1)

## 2020-03-28 LAB — GLUCOSE, CAPILLARY
Glucose-Capillary: 105 mg/dL — ABNORMAL HIGH (ref 70–99)
Glucose-Capillary: 121 mg/dL — ABNORMAL HIGH (ref 70–99)
Glucose-Capillary: 121 mg/dL — ABNORMAL HIGH (ref 70–99)
Glucose-Capillary: 156 mg/dL — ABNORMAL HIGH (ref 70–99)
Glucose-Capillary: 164 mg/dL — ABNORMAL HIGH (ref 70–99)
Glucose-Capillary: 173 mg/dL — ABNORMAL HIGH (ref 70–99)

## 2020-03-28 LAB — BODY FLUID CULTURE: Culture: NO GROWTH

## 2020-03-28 LAB — PROTIME-INR
INR: 1.7 — ABNORMAL HIGH (ref 0.8–1.2)
INR: 1.8 — ABNORMAL HIGH (ref 0.8–1.2)
INR: 1.7 — ABNORMAL HIGH (ref 0.8–1.2)
Prothrombin Time: 19.7 seconds — ABNORMAL HIGH (ref 11.4–15.2)
Prothrombin Time: 20 seconds — ABNORMAL HIGH (ref 11.4–15.2)
Prothrombin Time: 19.7 seconds — ABNORMAL HIGH (ref 11.4–15.2)

## 2020-03-28 LAB — CBC
HCT: 20.5 % — ABNORMAL LOW (ref 39.0–52.0)
HCT: 17.4 % — ABNORMAL LOW (ref 39.0–52.0)
Hemoglobin: 7.4 g/dL — ABNORMAL LOW (ref 13.0–17.0)
Hemoglobin: 6.3 g/dL — CL (ref 13.0–17.0)
MCH: 30.7 pg (ref 26.0–34.0)
MCH: 31.3 pg (ref 26.0–34.0)
MCHC: 36.1 g/dL — ABNORMAL HIGH (ref 30.0–36.0)
MCHC: 36.2 g/dL — ABNORMAL HIGH (ref 30.0–36.0)
MCV: 85.1 fL (ref 80.0–100.0)
MCV: 86.6 fL (ref 80.0–100.0)
Platelets: 65 10*3/uL — ABNORMAL LOW (ref 150–400)
Platelets: 79 10*3/uL — ABNORMAL LOW (ref 150–400)
RBC: 2.41 MIL/uL — ABNORMAL LOW (ref 4.22–5.81)
RBC: 2.01 MIL/uL — ABNORMAL LOW (ref 4.22–5.81)
RDW: 15.4 % (ref 11.5–15.5)
RDW: 15.8 % — ABNORMAL HIGH (ref 11.5–15.5)
WBC: 2.8 10*3/uL — ABNORMAL LOW (ref 4.0–10.5)
WBC: 4.5 10*3/uL (ref 4.0–10.5)
nRBC: 0 % (ref 0.0–0.2)
nRBC: 0.7 % — ABNORMAL HIGH (ref 0.0–0.2)

## 2020-03-28 LAB — FIBRINOGEN
Fibrinogen: 218 mg/dL (ref 210–475)
Fibrinogen: 218 mg/dL (ref 210–475)

## 2020-03-28 LAB — PHOSPHORUS: Phosphorus: 4.5 mg/dL (ref 2.5–4.6)

## 2020-03-28 LAB — PREPARE RBC (CROSSMATCH)

## 2020-03-28 LAB — MAGNESIUM: Magnesium: 2.5 mg/dL — ABNORMAL HIGH (ref 1.7–2.4)

## 2020-03-28 MED ORDER — LACTULOSE ENEMA
300.0000 mL | Freq: Two times a day (BID) | ORAL | Status: DC
  Administered 2020-03-28 (×2): 300 mL via RECTAL
  Filled 2020-03-28 (×3): qty 300

## 2020-03-28 MED ORDER — FOLIC ACID 5 MG/ML IJ SOLN
1.0000 mg | Freq: Once | INTRAMUSCULAR | Status: AC
  Administered 2020-03-28: 1 mg via INTRAVENOUS
  Filled 2020-03-28: qty 0.2

## 2020-03-28 MED ORDER — SODIUM CHLORIDE 0.9% IV SOLUTION: Freq: Once | INTRAVENOUS | Status: DC

## 2020-03-28 MED ORDER — SODIUM CHLORIDE 0.9% IV SOLUTION: Freq: Once | INTRAVENOUS | Status: AC

## 2020-03-28 MED ORDER — LORAZEPAM 2 MG/ML IJ SOLN
0.0000 mg | Freq: Two times a day (BID) | INTRAMUSCULAR | Status: DC
  Administered 2020-03-28: 2 mg via INTRAVENOUS
  Filled 2020-03-28: qty 1

## 2020-03-28 MED ORDER — FENTANYL CITRATE (PF) 100 MCG/2ML IJ SOLN
12.5000 ug | Freq: Once | INTRAMUSCULAR | Status: AC
  Administered 2020-03-28: 12.5 ug via INTRAVENOUS
  Filled 2020-03-28: qty 2

## 2020-03-28 MED ORDER — CALCIUM GLUCONATE-NACL 1-0.675 GM/50ML-% IV SOLN
1.0000 g | Freq: Once | INTRAVENOUS | Status: AC
  Administered 2020-03-28: 1000 mg via INTRAVENOUS
  Filled 2020-03-28: qty 50

## 2020-03-28 MED ORDER — ALBUMIN HUMAN 25 % IV SOLN
12.5000 g | Freq: Once | INTRAVENOUS | Status: AC
  Administered 2020-03-28: 12.5 g via INTRAVENOUS
  Filled 2020-03-28: qty 50

## 2020-03-28 MED ORDER — LORAZEPAM 2 MG/ML PO CONC: 0.0000 mg | Freq: Two times a day (BID) | ORAL | Status: DC

## 2020-03-28 MED ORDER — "THROMBI-PAD 3""X3"" EX PADS"
1.0000 | MEDICATED_PAD | Freq: Once | CUTANEOUS | Status: AC
  Administered 2020-03-28: 1 via TOPICAL
  Filled 2020-03-28: qty 1

## 2020-03-28 MED ORDER — LACTATED RINGERS IV BOLUS
500.0000 mL | Freq: Once | INTRAVENOUS | Status: AC
  Administered 2020-03-28: 500 mL via INTRAVENOUS

## 2020-03-28 MED ORDER — SODIUM CHLORIDE 0.9 % IV SOLN: 1.0000 mg | Freq: Once | INTRAVENOUS | Status: DC

## 2020-03-28 NOTE — Progress Notes (Signed)
CRITICAL VALUE ALERT  Critical Value:  hgb 6.3  Date & Time Notied:  03/28/20 @ 2253  Provider Notified: Elink informed  Orders Received/Actions taken: awaiting new orders.

## 2020-03-28 NOTE — Progress Notes (Addendum)
eLink Physician-Brief Progress Note Patient Name: Cory Aguirre DOB: 22-Jul-1956 MRN: [id number]   Date of Service  03/28/2020  HPI/Events of Note  Asking for pain meds. Bed side not comfortable in sending for CT abd/pelvis. He might code. Camera: He seems in pain. Tachycardic. Moving his head side ways on and off. On nasal o2.   eICU Interventions  - low dose fenta IV once. - told [facility] MD Dr Coy Saunas to see him when he goes to ED.  - asp precautions.     Intervention Category Intermediate Interventions: Abdominal pain - evaluation and management  Ranee Gosselin 03/28/2020, 11:54 PM   00;05  Camera red alert: Having ecchymosis seeping of pinkish blood from groin, scrotum, no melena.  Getting fenta for pain. Limited US at bed side stat. Getting PRBC. Bed side team is going to see him soon.   1:32 Sinus tachy, fenta made hin=m rest with HR 100. Could be alcohol withdrawal. MELD score high. LA not getting cleared.  USG: IMPRESSION: 1. Small volume of ascites, stable from the CT 03/27/2020. 2. Nodular liver contour compatible with Cirrhosis. 3. Complex collection within or adjacent to the urinary bladder again noted in the pelvis.  - bed side team there at bed side now. Follow LA at 5 AM. To go on Precedex gtt.  Follow Hg post PRBC.  Urology follow up.   3:56 CT abd: enlarging bladder hematoma. UOP aneuric.   IMPRESSION: 1. Large hematoma within the anterior pelvis, likely within the urinary bladder. Collection has increased in size. 2. Large amount of ascites, large pleural effusions and anasarca. 3. Aortic Atherosclerosis (ICD10-I70.0).  Will call Urology On max levo. follow Hg post PRBC.

## 2020-03-28 NOTE — Progress Notes (Signed)
Chief Complaint: Pelvic hemorrhage  Referring Physician(s): Jeannette CorpusAventura, Emily  Supervising Physician: Richarda OverlieHenn, Adam  [facility]  Brief History: Cory Aguirre is a 64 y.o. male with recent admission for hypotension, altered mental status and acute alcoholic hepatitis.   He has undergone 2 paracentesis procedures by [facility].    He had decreased urine output and US demonstrated a complex pelvic fluid collection.  Evaluated by Urology and foley appeared to be well positioned and no significant blood in bladder.    CT demonstrated a large hematoma in the anterior pelvis between the abdominal wall and the bladder (? Space of [place]) with areas of active contrast extravasation.  He was requiring pressors to maintain BP and had been receiving blood products throughout the day.   He has low platelet count and elevated INR due to liver disease.  Dr. Lowella DandyHenn performed angiography and found active bleeding form a branch of the left inferior epigastric artery. The feeding branch was embolized with coils.  Subjective: Patient is lying in bed, sleepy, difficult to arouse.  History reviewed. No pertinent past medical history.  History reviewed. No pertinent surgical history.  Allergies: Patient has no known allergies.  Medications: Prior to Admission medications   Medication Sig Start Date End Date Taking? Authorizing Provider  pantoprazole (PROTONIX) 20 MG tablet Take 1 tablet (20 mg total) by mouth daily. Patient not taking: Reported on 04/17/2020 11/16/15   Gilda CreasePollina, Christopher J, MD     History reviewed. No pertinent family history.  Social History   Socioeconomic History  . Marital status: Single    Spouse name: Not on file  . Number of children: Not on file  . Years of education: Not on file  . Highest education level: Not on file  Occupational History  . Not on file  Tobacco Use  . Smoking status: Never Smoker  . Smokeless tobacco: Current User  Substance  and Sexual Activity  . Alcohol use: Yes    Comment: 3-4 times a week   . Drug use: No  . Sexual activity: Not on file  Other Topics Concern  . Not on file  Social History Narrative  . Not on file   Social Determinants of Health   Financial Resource Strain:   . Difficulty of Paying Living Expenses:   Food Insecurity:   . Worried About Programme researcher, broadcasting/film/videounning Out of Food in the Last Year:   . Baristaan Out of Food in the Last Year:   Transportation Needs:   . Freight forwarderLack of Transportation (Medical):   Marland Kitchen. Lack of Transportation (Non-Medical):   Physical Activity:   . Days of Exercise per Week:   . Minutes of Exercise per Session:   Stress:   . Feeling of Stress :   Social Connections:   . Frequency of Communication with Friends and Family:   . Frequency of Social Gatherings with Friends and Family:   . Attends Religious Services:   . Active Member of Clubs or Organizations:   . Attends BankerClub or Organization Meetings:   Marland Kitchen. Marital Status:      Review of Systems: A 12 point ROS discussed and pertinent positives are indicated in the HPI above.  All other systems are negative.  Review of Systems  Vital Signs: BP 100/65   Pulse (!) 102   Temp (!) 97.1 F (36.2 C) (Axillary)   Resp (!) 24   Ht 5\' 9"  (1.753 m)   Wt 69.4 kg   SpO2 90%  BMI 22.59 kg/m   Physical Exam Constitutional:      Comments: Sleepy, difficult to arouse  HENT:     Head: Normocephalic and atraumatic.  Eyes:     General: Scleral icterus present.  Cardiovascular:     Rate and Rhythm: Normal rate.  Pulmonary:     Effort: Pulmonary effort is normal. No respiratory distress.  Abdominal:     General: There is distension.     Palpations: Abdomen is soft.     Tenderness: There is no abdominal tenderness.  Musculoskeletal:     Comments: Right common femoral artery access site without hematoma or pseudoaneurysm. Small amount of drainage on dressing, but stable.  Skin:    General: Skin is warm and dry.     Coloration: Skin is  jaundiced.  Neurological:     General: No focal deficit present.     Mental Status: He is disoriented.     Imaging: DG Chest 1 View  Result Date: 03/25/2020 CLINICAL DATA:  Central line placement EXAM: CHEST  1 VIEW COMPARISON:  Same day chest radiograph FINDINGS: Interval placement of left subclavian approach central venous catheter with distal tip terminating at the level of the superior cavoatrial junction. Stable heart size. Atherosclerotic calcification of the aortic knob. Low lung volumes with hazy bibasilar opacities. Trace bilateral pleural effusions not excluded. No pneumothorax. IMPRESSION: 1. Interval placement of left subclavian approach central venous catheter with distal tip terminating at the level of the superior cavoatrial junction. No pneumothorax. 2. New hazy bibasilar opacities, which may reflect atelectasis accentuated by low lung volumes. Mild edema and/or small bilateral pleural effusions could also have a similar appearance. Electronically Signed   By: Duanne Guess D.O.   On: 03/25/2020 13:14   CT Head Wo Contrast  Result Date: 03/25/2020 CLINICAL DATA:  Mental status change EXAM: CT HEAD WITHOUT CONTRAST TECHNIQUE: Contiguous axial images were obtained from the base of the skull through the vertex without intravenous contrast. COMPARISON:  None. FINDINGS: Brain: No acute territorial infarction, hemorrhage, or intracranial mass. Mild atrophy. Mild hypodensity in the white matter consistent with chronic small vessel ischemic change. Nonenlarged ventricles. Vascular: No hyperdense vessels.  Carotid vascular calcification Skull: Normal. Negative for fracture or focal lesion. Sinuses/Orbits: No acute finding. Other: None IMPRESSION: 1. No CT evidence for acute intracranial abnormality. 2. Atrophy and mild chronic small vessel ischemic changes of the white matter. Electronically Signed   By: Jasmine Pang M.D.   On: 03/25/2020 02:56   NM Hepatobiliary Liver Func  Result Date:  03/26/2020 CLINICAL DATA:  Chronic upper abdominal pain. Jaundice with elevated liver function studies. EXAM: NUCLEAR MEDICINE HEPATOBILIARY IMAGING TECHNIQUE: Sequential images of the abdomen were obtained out to 60 minutes following intravenous administration of radiopharmaceutical. RADIOPHARMACEUTICALS:  5.3 mCi Tc-29m  Choletec IV COMPARISON:  Ultrasound and CT 03/25/2020. FINDINGS: Mildly delayed hepatic uptake with limited biliary excretion. Gallbladder activity is visualized, consistent with patency of cystic duct. No passage of biliary activity into the small bowel is identified, likely related to hepatic dysfunction. IMPRESSION: 1. The cystic duct is patent with filling of the gallbladder lumen. 2. Delayed hepatic uptake and limited biliary excretion with no passage of activity into the small bowel, likely related to hepatic dysfunction. No biliary dilatation on recent imaging. Electronically Signed   By: Carey Bullocks M.D.   On: 03/26/2020 12:42   CT ABDOMEN PELVIS W CONTRAST  Result Date: 03/27/2020 CLINICAL DATA:  Concern for bleeding into bladder by prior ultrasound EXAM: CT ABDOMEN  AND PELVIS WITH CONTRAST TECHNIQUE: Multidetector CT imaging of the abdomen and pelvis was performed using the standard protocol following bolus administration of intravenous contrast. CONTRAST:  81mL OMNIPAQUE IOHEXOL 300 MG/ML  SOLN COMPARISON:  03/25/2020 CT.  Renal ultrasound 03/27/2020 FINDINGS: Lower chest: Moderate to large bilateral pleural effusions, increasing since prior study. Compressive atelectasis in the lower lobes. Hepatobiliary: Suggestion of nodularity to the surface of the liver again noted. No focal hepatic abnormality. High-density material seen within the gallbladder could reflect vicarious excretion of contrast, similar prior study. Pancreas: No focal abnormality or ductal dilatation. Spleen: Scattered low-density lesions within the spleen. The largest noted medially measuring 3.2 cm. Spleen is  enlarged measuring 13.9 cm. Adrenals/Urinary Tract: No renal or adrenal mass. No hydronephrosis. Delayed imaging through the kidneys demonstrates no significant excretion of contrast into the collecting systems bilaterally. In what appears to be the bladder within the pelvis, there are layering hematocrit levels and fluid levels. This is felt to most likely be blood within the urinary bladder. On the sagittal view, it appears that the Foley catheter is posterior to what is felt represent the bladder. Conceivably, this could be a complex fluid collection/hematoma anterior to a decompressed urinary bladder, but this is difficult to confirm. Stomach/Bowel: Stomach, large and small bowel grossly unremarkable. Vascular/Lymphatic: Aortic atherosclerosis. No enlarged abdominal or pelvic lymph nodes. Reproductive: No visible focal abnormality. Other: Moderate ascites throughout the abdomen and pelvis. Musculoskeletal: No acute bony abnormality. IMPRESSION: Rounded area with hematocrit/fluid levels in the pelvis felt to most likely be the urinary bladder. It appears that the indwelling Foley catheter is posterior to this fluid collection. This conceivably could reflect a complex fluid collection/hematoma anterior to a decompressed urinary bladder. 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 Areas of low-density within the spleen, nonspecific and stable since prior study. Delayed excretion of contrast from the kidneys into the collecting systems bilaterally. No hydronephrosis. Electronically Signed   By: Charlett Nose M.D.   On: 03/27/2020 17:51   CT ABDOMEN PELVIS W CONTRAST  Result Date: 03/25/2020 CLINICAL DATA:  Jaundice EXAM: CT ABDOMEN AND PELVIS WITH CONTRAST TECHNIQUE: Multidetector CT imaging of the abdomen and pelvis was performed using the standard protocol following bolus administration of intravenous contrast. CONTRAST:  51mL OMNIPAQUE IOHEXOL 300 MG/ML  SOLN COMPARISON:  None. FINDINGS: Lower  chest: Lung bases demonstrate small bilateral pleural effusions. Cardiac size within normal limits. Hepatobiliary: No focal hepatic abnormality. Hyperdense sludge or vicarious contrast excretion within the gallbladder. Possible gallbladder wall thickening or edema. Possible contour nodularity of the liver. Pancreas: Unremarkable. No pancreatic ductal dilatation or surrounding inflammatory changes. Spleen: Enlarged up to 15 cm. Adrenals/Urinary Tract: Adrenal glands are normal. Kidneys show no hydronephrosis. The urinary bladder is unremarkable. Stomach/Bowel: Stomach nonenlarged. Thickened slightly dilated appearing jejunal small bowel loops in the left upper quadrant. Generalized fluid-filled small bowel without well-defined transition. Mild wall thickening of the hepatic flexure and ascending colon. Negative appendix. Vascular/Lymphatic: Moderate aortic atherosclerosis. No aneurysm. No suspicious nodes Reproductive: Prostate is unremarkable.  Prostate calcifications. Other: No free air. Moderate volume of ascites within the abdomen and pelvis Musculoskeletal: No acute or suspicious osseous abnormality. IMPRESSION: 1. Small right greater than left pleural effusions. 2. Suspected liver disease. There is splenomegaly and moderate volume of ascites within the abdomen and pelvis. 3. Diffuse fluid within the small bowel. Mild thickened appearance of left upper quadrant jejunal small bowel loops with mild wall thickening of the ascending colon and hepatic flexure. Findings could be secondary to  enteritis/colitis versus portal enteropathy. 4. Hyperdense sludge within the gallbladder. Possible gallbladder wall thickening or edema, correlation with right upper quadrant ultrasound could be obtained as indicated Aortic Atherosclerosis (ICD10-I70.0). Electronically Signed   By: Jasmine Pang M.D.   On: 03/25/2020 03:35   US RENAL  Result Date: 03/27/2020 CLINICAL DATA:  Renal failure. EXAM: RENAL / URINARY TRACT ULTRASOUND  COMPLETE COMPARISON:  CT of the abdomen and pelvis on 03/25/2020 FINDINGS: Right Kidney: Renal measurements: 11.1 x 4.5 x 4.4 centimeters = volume: 114.5 mL . Echogenicity within normal limits. No mass or hydronephrosis visualized. Left Kidney: Renal measurements: 10.6 x 5.3 x 4.8 centimeters = volume: 140.1 mL. Echogenicity within normal limits. No mass or hydronephrosis visualized. Bladder: Heterogeneous material identified within the urinary bladder, consistent with blood clot. A Foley catheter is not identified within the lumen of the urinary bladder. Parallel hyperechoic linear structures are along the posterior aspect of the bladder, raising the question of malposition of the Foley catheter. Other: Ascites. IMPRESSION: 1. No hydronephrosis or renal mass. 2. Large amount of heterogeneous material within the urinary bladder, consistent with blood clot. 3. Foley catheter is not identified within the urinary bladder. There are linear hyperechoic structures along the posterior aspect of the bladder, raising the question of malposition of the Foley catheter. 4. Ascites. Critical Value/emergent results were called by telephone at the time of interpretation on 03/27/2020 at 1:05 pm to provider Dr. Merry Lofty, who verbally acknowledged these results. Electronically Signed   By: Norva Pavlov M.D.   On: 03/27/2020 13:05   DG Chest Port 1 View  Result Date: 03/28/2020 CLINICAL DATA:  Abnormal respiration EXAM: PORTABLE CHEST 1 VIEW COMPARISON:  03/25/2020 FINDINGS: Layering moderate left pleural effusion. Small right pleural effusion. Associated left lower lobe opacity, likely atelectasis. These findings may be mildly progressive from the prior. No pneumothorax. Left subclavian venous catheter terminating at the cavoatrial junction. The heart is normal in size. IMPRESSION: Moderate left and small right pleural effusions, mildly progressive. Left lower lobe opacity, likely atelectasis. Electronically Signed    By: Charline Bills M.D.   On: 03/28/2020 04:16   DG Chest Portable 1 View  Result Date: 03/25/2020 CLINICAL DATA:  Sepsis EXAM: PORTABLE CHEST 1 VIEW COMPARISON:  11/16/2015 FINDINGS: The heart size and mediastinal contours are within normal limits. Both lungs are clear. The visualized skeletal structures are unremarkable. Calcification of the aorta. IMPRESSION: No active disease. Electronically Signed   By: Burman Nieves M.D.   On: 03/25/2020 01:07   US Abdomen Limited RUQ  Result Date: 03/25/2020 CLINICAL DATA:  Elevated liver function studies.  Severe jaundice. EXAM: ULTRASOUND ABDOMEN LIMITED RIGHT UPPER QUADRANT COMPARISON:  CT 03/25/2020 FINDINGS: Gallbladder: Gallbladder is filled with sludge with thickened wall and pericholecystic edema. Murphy's sign is positive. No discrete stones are identified. Common bile duct: Diameter: 5 mm, normal Liver: Heterogeneous liver parenchymal echotexture suggesting cirrhosis or fatty infiltration. No focal lesions identified. Portal vein is patent on color Doppler imaging with normal direction of blood flow towards the liver. Other: Upper abdominal ascites is present. IMPRESSION: 1. Gallbladder is filled with sludge, thickened wall, and pericholecystic edema. Positive Murphy's sign. Changes may represent acute cholecystitis in the appropriate clinical setting. 2. Heterogeneous liver parenchymal echotexture suggesting cirrhosis or fatty infiltration. 3. Upper abdominal ascites. Electronically Signed   By: Burman Nieves M.D.   On: 03/25/2020 05:49    Labs:  CBC: Recent Labs    03/26/20 0831 03/27/20 0509 03/27/20 1600 03/28/20 0500  WBC 1.2* 2.3* 2.7* 1.7*  HGB 6.8* 6.3* 5.8* 6.4*  HCT 19.4* 18.0* 16.5* 18.1*  PLT 38* 48* 70* 43*    COAGS: Recent Labs    03/25/20 2101 03/25/20 2101 03/26/20 0414 03/27/20 0509 03/27/20 1600 03/28/20 0500  INR 2.1*   < > 1.9* 2.3* 1.7* 1.7*  APTT 70*  --   --   --   --   --    < > = values in this  interval not displayed.    BMP: Recent Labs    03/26/20 0414 03/26/20 2009 03/27/20 0509 03/28/20 0500  NA 127* 128* 128* 127*  K 3.4* 3.5 3.8 3.7  CL 91* 91* 92* 91*  CO2 GLUCOSE 156* 82 121* 180*  BUN 42* 42* 41* 47*  CALCIUM 6.0* 5.9* 6.2* 6.5*  CREATININE 1.28* 1.26* 1.67* 2.01*  GFRNONAA 59* >60 43* 34*  GFRAA >60 >60 50* 40*    LIVER FUNCTION TESTS: Recent Labs    03/26/20 0414 03/26/20 2009 03/27/20 0509 03/28/20 0500  BILITOT 11.0* 13.3* 12.1* 13.8*  AST 542* 609* 490* 290*  ALT 106* 120* 108* 80*  ALKPHOS 232* 248* 219* 144*  PROT 3.5* 3.6* 3.2* 3.6*  ALBUMIN 2.0* 2.0* 1.6* 1.9*    TUMOR MARKERS: No results for input(s): AFPTM, CEA, CA199, CHROMGRNA in the last 8760 hours.  Assessment and Plan:  Left pelvic hemorrhage.  S/P  angiography with embolization of the feeding branch of the left inferior epigastric artery.   Can remove right femoral dressing tomorrow.  Thank you for this interesting consult.  I greatly enjoyed meeting Cory Aguirre and look forward to participating in their care.  A copy of this report was sent to the requesting provider on this date.  Electronically Signed: Gwynneth Macleod, PA-C   03/28/2020, 9:49 AM      I spent a total of  15 Minutes in face to face in clinical consultation, greater than 50% of which was counseling/coordinating care for f/u after angiography/embo.

## 2020-03-28 NOTE — Care Plan (Signed)
I updated his girlfriend Harriett at bedside and his sister [name] via phone. They understand that his MELD predicts >60% mortality within the next 3 months.   Steffanie Dunn, DO 03/28/20 2:12 PM [facility]

## 2020-03-28 NOTE — Progress Notes (Signed)
[facility] Progress Note    Since last GI note: Events yesterday noted, IR coil embo to actively bleeding branch of left inferior epigastric artery.   His mental status has declined, getting combative and so was restrained.  Not anwering my questions this AM but responds to pain  Objective: Vital signs in last 24 hours: Temp:  [95 F (35 C)-98.2 F (36.8 C)] 97.4 F (36.3 C) (08/08 0703) Pulse Rate:  [90-112] 101 (08/08 0703) Resp:  [16-41] 23 (08/08 0703) BP: (84-133)/(49-88) 95/59 (08/08 0703) SpO2:  [76 %-99 %] 93 % (08/08 0703) Weight:  [69.4 kg] 69.4 kg (08/08 0401) Last BM Date: 03/28/20 General: alert and oriented times 0 Heart: regular rate and rythm Abdomen: soft, non-tender, non-distended, normal bowel sounds  Lab Results: Recent Labs    03/27/20 0509 03/27/20 1600 03/28/20 0500  WBC 2.3* 2.7* 1.7*  HGB 6.3* 5.8* 6.4*  PLT 48* 70* 43*  MCV 87.8 88.2 85.0   Recent Labs    03/26/20 2009 03/27/20 0509 03/28/20 0500  NA 128* 128* 127*  K 3.5 3.8 3.7  CL 91* 92* 91*  CO2 27 22 23   GLUCOSE 82 121* 180*  BUN 42* 41* 47*  CREATININE 1.26* 1.67* 2.01*  CALCIUM 5.9* 6.2* 6.5*   Recent Labs    03/26/20 2009 03/27/20 0509 03/28/20 0500  PROT 3.6* 3.2* 3.6*  ALBUMIN 2.0* 1.6* 1.9*  AST 609* 490* PENDING  ALT 120* 108* 80*  ALKPHOS 248* 219* 144*  BILITOT 13.3* 12.1* 13.8*   Recent Labs    03/27/20 1600 03/28/20 0500  INR 1.7* 1.7*   Ammonia 8/7 53 (elevated a bit) Ascites testing: acid fast smear negative; culture still pending however. SAAG was 1.7 (elevated, based on paracentesis 8/6 albumin 0.3, serum album that day 2), consistent with portal HTN. No sign of SBP. Cytology pending.  Medications: Scheduled Meds: . sodium chloride   Intravenous Once  . sodium chloride   Intravenous Once  . sodium chloride   Intravenous Once  . chlorhexidine  15 mL Mouth Rinse BID  . Chlorhexidine Gluconate Cloth  6 each Topical Daily  . folic  acid  1 mg Oral Daily  . insulin aspart  0-15 Units Subcutaneous Q4H  . lactulose  10 g Oral TID  . lidocaine      . LORazepam  0-4 mg Oral Q12H  . mouth rinse  15 mL Mouth Rinse BID  . mouth rinse  15 mL Mouth Rinse q12n4p  . multivitamin with minerals  1 tablet Oral Daily  . pantoprazole (PROTONIX) IV  40 mg Intravenous Q12H  . prednisoLONE  40 mg Oral QAC breakfast  . thiamine  100 mg Oral Daily   Or  . thiamine  100 mg Intravenous Daily   Continuous Infusions: . ceFEPime (MAXIPIME) IV Stopped (03/28/20 0129)  . dextrose 5% lactated ringers 50 mL/hr at 03/27/20 0546  . metronidazole 500 mg (03/28/20 0559)  . norepinephrine (LEVOPHED) Adult infusion 2 mcg/min (03/28/20 0300)  . phytonadione (VITAMIN K) IV Stopped (03/27/20 1133)   PRN Meds:.docusate sodium, iohexol, ondansetron (ZOFRAN) IV, polyethylene glycol    Assessment/Plan: 64 y.o. male with severe alcoholic hepatitis, probable underlying cirrhosis (CT 8/7 suggested nodular liver) with hepatic failure  Intraabdominal bleeding seems stopped after IR procedure last night.  This may have been due to paracentesis however that was 10/7 guided and yielded non bloody fluid.  Elevated INR and low platelets certainly contributes and the bleeding may have just been spontaneous.  Current MELD is 29 and MELD-Na is 33.    MS deteriorating I agree with lactulose.    Care is supportive. Would continue broad spect abx even though we don't have proof of underlying infection.  Prednisolone started yesterday for severe alc hep.  Prognosis seems quite poor at this point.   Will follow along.    Rachael Fee, MD  03/28/2020, 8:21 AM [facility] Pager [phone]

## 2020-03-28 NOTE — Progress Notes (Signed)
Pharmacy Antibiotic Note  Cory Aguirre is a 64 y.o. male admitted on 04/20/2020 with sepsis.  Pharmacy has been consulted for Cefepime dosing.  Day 4 Cefepime afebrile WBC 1.7 dropping Scr trending UP  Plan: Per current renal function, continue cefepime 2g IV q12 Monitor renal function and cx data    Height: 5\' 9"  (175.3 cm) Weight: 69.4 kg (153 lb) IBW/kg (Calculated) : 70.7  Temp (24hrs), Avg:96.9 F (36.1 C), Min:95 F (35 C), Max:98.2 F (36.8 C)  Recent Labs  Lab 03/25/20 0506 03/25/20 0619 03/25/20 1640 03/25/20 1700 03/25/20 1800 03/26/20 0414 03/26/20 0559 03/26/20 0831 03/26/20 2009 03/27/20 0046 03/27/20 0509 03/27/20 0626 03/27/20 1600 03/28/20 0500  WBC  --   --  2.4*  --   --   --   --  1.2*  --   --  2.3*  --  2.7* 1.7*  CREATININE  --    < >  --  1.28*  --  1.28*  --   --  1.26*  --  1.67*  --   --  2.01*  LATICACIDVEN   < >  --   --   --  2.5*  --  3.1*  --   --  4.7*  --  6.6*  --  3.3*   < > = values in this interval not displayed.    Estimated Creatinine Clearance: 36.9 mL/min (A) (by C-G formula based on SCr of 2.01 mg/dL (H)).    No Known Allergies  Thank you for allowing pharmacy to be a part of this patient's care.  05/28/20 PharmD, BCPS 03/28/2020 10:52 AM

## 2020-03-28 NOTE — Consult Note (Signed)
Urology Consult   Physician requesting consult: Karie Fetch, DO  Reason for consult: Concern for bladder clot  Subjective/Interval: Underwent angio/embolization of left epigastric yesterday evening after CT revealed large hematoma possible active bleed into the space of Retzius. Hb 6.4 this morning from 5.8. Increasing confusion overnight/this am. Weaning pressors.   Physical Exam:  Vital signs in last 24 hours: Temp:  [95 F (35 C)-98.2 F (36.8 C)] 97.1 F (36.2 C) (08/08 0857) Pulse Rate:  [90-112] 102 (08/08 0900) Resp:  [16-41] 24 (08/08 0900) BP: (84-133)/(49-88) 100/65 (08/08 0900) SpO2:  [76 %-99 %] 90 % (08/08 0900) Weight:  [69.4 kg] 69.4 kg (08/08 0401) Constitutional: Intermittently alert, lethargic. Scleral icterus, jaundiced Cardiovascular: Regular rate and rhythm Respiratory: Normal respiratory effort GI: Palpable firmness in the suprapubic region, tender to palpation. GU: Foley catheter in place, minimal urine output, cola colored urine  Laboratory Data:  Recent Labs    03/25/20 1640 03/25/20 1640 03/25/20 2101 03/26/20 0831 03/27/20 0509 03/27/20 1600 03/28/20 0500  WBC 2.4*  --   --  1.2* 2.3* 2.7* 1.7*  HGB 8.5*  --   --  6.8* 6.3* 5.8* 6.4*  HCT 24.0*  --   --  19.4* 18.0* 16.5* 18.1*  PLT 56*   < > 51* 38* 48* 70* 43*   < > = values in this interval not displayed.    Recent Labs    03/25/20 1700 03/26/20 0414 03/26/20 2009 03/27/20 0509 03/28/20 0500  NA 122* 127* 128* 128* 127*  K 3.7 3.4* 3.5 3.8 3.7  CL 91* 91* 91* 92* 91*  GLUCOSE 117* 156* 82 121* 180*  BUN 44* 42* 42* 41* 47*  CALCIUM 5.5* 6.0* 5.9* 6.2* 6.5*  CREATININE 1.28* 1.28* 1.26* 1.67* 2.01*     Results for orders placed or performed during the hospital encounter of 2020-04-23 (from the past 24 hour(s))  Occult blood card to lab, stool     Status: Abnormal   Collection Time: 03/27/20 11:28 AM  Result Value Ref Range   Fecal Occult Bld POSITIVE (A) NEGATIVE  Prepare  fresh frozen plasma     Status: None (Preliminary result)   Collection Time: 03/27/20 11:36 AM  Result Value Ref Range   Unit Number [id number]    Blood Component Type THAWED PLASMA    Unit division 00    Status of Unit ISSUED    Transfusion Status OK TO TRANSFUSE    Unit Number [id number]    Blood Component Type THAWED PLASMA    Unit division 00    Status of Unit ISSUED    Transfusion Status      OK TO TRANSFUSE Performed at [facility], [address], [place], [place] [address]   Glucose, capillary     Status: Abnormal   Collection Time: 03/27/20 12:34 PM  Result Value Ref Range   Glucose-Capillary 126 (H) 70 - 99 mg/dL  Prepare Pheresed Platelets     Status: None (Preliminary result)   Collection Time: 03/27/20  1:30 PM  Result Value Ref Range   Unit Number [id number]    Blood Component Type PLTP2 PSORALEN TREATED    Unit division 00    Status of Unit ISSUED    Transfusion Status      OK TO TRANSFUSE Performed at [facility], [address], [place], [place] [address]   Protime-INR     Status: Abnormal   Collection Time: 03/27/20  4:00 PM  Result Value Ref Range  Prothrombin Time 18.9 (H) 11.4 - 15.2 seconds   INR 1.7 (H) 0.8 - 1.2  Ammonia     Status: Abnormal   Collection Time: 03/27/20  4:00 PM  Result Value Ref Range   Ammonia 53 (H) 9 - 35 umol/L  CBC     Status: Abnormal   Collection Time: 03/27/20  4:00 PM  Result Value Ref Range   WBC 2.7 (L) 4.0 - 10.5 K/uL   RBC 1.87 (L) 4.22 - 5.81 MIL/uL   Hemoglobin 5.8 (LL) 13.0 - 17.0 g/dL   HCT 62.1 (L) 39 - 52 %   MCV 88.2 80.0 - 100.0 fL   MCH 31.0 26.0 - 34.0 pg   MCHC 35.2 30.0 - 36.0 g/dL   RDW 30.8 (H) 65.7 - 84.6 %   Platelets 70 (L) 150 - 400 K/uL   nRBC 0.0 0.0 - 0.2 %  Glucose, capillary     Status: Abnormal   Collection Time: 03/27/20  4:03 PM  Result Value Ref Range   Glucose-Capillary 123 (H) 70 - 99 mg/dL   Comment 1 Document in Chart    Prepare RBC (crossmatch)     Status: None   Collection Time: 03/27/20  5:09 PM  Result Value Ref Range   Order Confirmation      ORDER PROCESSED BY BLOOD BANK Performed at [facility], [address], [place], [place] [address]   Prepare fresh frozen plasma     Status: None (Preliminary result)   Collection Time: 03/27/20  5:10 PM  Result Value Ref Range   Unit Number [id number]    Blood Component Type THAWED PLASMA    Unit division 00    Status of Unit ISSUED    Transfusion Status OK TO TRANSFUSE    Unit Number [id number]    Blood Component Type THAWED PLASMA    Unit division 00    Status of Unit ISSUED    Transfusion Status      OK TO TRANSFUSE Performed at [facility], [address] [place], [place] [address]   Prepare Pheresed Platelets     Status: None (Preliminary result)   Collection Time: 03/27/20  5:30 PM  Result Value Ref Range   Unit Number [id number]    Blood Component Type PLTP1 PSORALEN TREATED    Unit division 00    Status of Unit ISSUED    Transfusion Status      OK TO TRANSFUSE Performed at [facility], [address], [place], [place] [address]   Prepare RBC (crossmatch)     Status: None   Collection Time: 03/27/20  7:06 PM  Result Value Ref Range   Order Confirmation      ORDER PROCESSED BY BLOOD BANK Performed at [facility], [address], [place], [place] [address]   Prepare Pheresed Platelets     Status: None (Preliminary result)   Collection Time: 03/27/20  7:10 PM  Result Value Ref Range   Unit Number [id number]    Blood Component Type PLTP2 PSORALEN TREATED    Unit division 00    Status of Unit ISSUED    Transfusion Status      OK TO TRANSFUSE Performed at [facility], [address], [place], [place] [address]    Unit Number [id number]    Blood Component Type PLTP1 PSORALEN TREATED    Unit division 00    Status of  Unit ALLOCATED    Transfusion Status  OK TO TRANSFUSE   Prepare cryoprecipitate     Status: None (Preliminary result)   Collection Time: 03/27/20  7:11 PM  Result Value Ref Range   Unit Number [id number]    Blood Component Type CRYPOOL THAW    Unit division 00    Status of Unit ISSUED    Transfusion Status OK TO TRANSFUSE    Unit Number [id number]    Blood Component Type CRYPOOL THAW    Unit division 00    Status of Unit ALLOCATED    Transfusion Status      OK TO TRANSFUSE Performed at [facility], [address] [place], [place] [address]   Glucose, capillary     Status: Abnormal   Collection Time: 03/27/20  7:26 PM  Result Value Ref Range   Glucose-Capillary 120 (H) 70 - 99 mg/dL   Comment 1 Document in Chart   Glucose, capillary     Status: Abnormal   Collection Time: 03/27/20 11:39 PM  Result Value Ref Range   Glucose-Capillary 136 (H) 70 - 99 mg/dL  Glucose, capillary     Status: Abnormal   Collection Time: 03/28/20  4:10 AM  Result Value Ref Range   Glucose-Capillary 164 (H) 70 - 99 mg/dL   Comment 1 Notify RN    Comment 2 Document in Chart   Protime-INR     Status: Abnormal   Collection Time: 03/28/20  5:00 AM  Result Value Ref Range   Prothrombin Time 19.7 (H) 11.4 - 15.2 seconds   INR 1.7 (H) 0.8 - 1.2  Magnesium     Status: Abnormal   Collection Time: 03/28/20  5:00 AM  Result Value Ref Range   Magnesium 2.5 (H) 1.7 - 2.4 mg/dL  Phosphorus     Status: None   Collection Time: 03/28/20  5:00 AM  Result Value Ref Range   Phosphorus 4.5 2.5 - 4.6 mg/dL  CBC with Differential/Platelet     Status: Abnormal   Collection Time: 03/28/20  5:00 AM  Result Value Ref Range   WBC 1.7 (L) 4.0 - 10.5 K/uL   RBC 2.13 (L) 4.22 - 5.81 MIL/uL   Hemoglobin 6.4 (LL) 13.0 - 17.0 g/dL   HCT 63.7 (L) 39 - 52 %   MCV 85.0 80.0 - 100.0 fL   MCH 30.0 26.0 - 34.0 pg   MCHC 35.4 30.0 - 36.0 g/dL   RDW 85.8 (H) 85.0 - 27.7 %   Platelets 43 (L) 150 - 400  K/uL   nRBC 0.0 0.0 - 0.2 %   Neutrophils Relative % 76 %   Neutro Abs 1.3 (L) 1.7 - 7.7 K/uL   Lymphocytes Relative 12 %   Lymphs Abs 0.2 (L) 0.7 - 4.0 K/uL   Monocytes Relative 10 %   Monocytes Absolute 0.2 0 - 1 K/uL   Eosinophils Relative 0 %   Eosinophils Absolute 0.0 0 - 0 K/uL   Basophils Relative 0 %   Basophils Absolute 0.0 0 - 0 K/uL   Immature Granulocytes 2 %   Abs Immature Granulocytes 0.03 0.00 - 0.07 K/uL   Polychromasia PRESENT   Lactic acid, plasma     Status: Abnormal   Collection Time: 03/28/20  5:00 AM  Result Value Ref Range   Lactic Acid, Venous 3.3 (HH) 0.5 - 1.9 mmol/L  Fibrinogen     Status: None   Collection Time: 03/28/20  5:00 AM  Result Value Ref Range   Fibrinogen 218 210 - 475  mg/dL  Comprehensive metabolic panel     Status: Abnormal   Collection Time: 03/28/20  5:00 AM  Result Value Ref Range   Sodium 127 (L) 135 - 145 mmol/L   Potassium 3.7 3.5 - 5.1 mmol/L   Chloride 91 (L) 98 - 111 mmol/L   CO2 23 22 - 32 mmol/L   Glucose, Bld 180 (H) 70 - 99 mg/dL   BUN 47 (H) 8 - 23 mg/dL   Creatinine, Ser 5.40 (H) 0.61 - 1.24 mg/dL   Calcium 6.5 (L) 8.9 - 10.3 mg/dL   Total Protein 3.6 (L) 6.5 - 8.1 g/dL   Albumin 1.9 (L) 3.5 - 5.0 g/dL   AST 981 (H) 15 - 41 U/L   ALT 80 (H) 0 - 44 U/L   Alkaline Phosphatase 144 (H) 38 - 126 U/L   Total Bilirubin 13.8 (H) 0.3 - 1.2 mg/dL   GFR calc non Af Amer 34 (L) >60 mL/min   GFR calc Af Amer 40 (L) >60 mL/min   Anion gap 13 5 - 15  Glucose, capillary     Status: Abnormal   Collection Time: 03/28/20  8:11 AM  Result Value Ref Range   Glucose-Capillary 173 (H) 70 - 99 mg/dL  Prepare fresh frozen plasma     Status: None (Preliminary result)   Collection Time: 03/28/20  9:39 AM  Result Value Ref Range   Unit Number [id number]    Blood Component Type THAWED PLASMA    Unit division 00    Status of Unit ALLOCATED    Transfusion Status OK TO TRANSFUSE    Unit Number [id number]    Blood Component Type  THAWED PLASMA    Unit division 00    Status of Unit ISSUED    Transfusion Status      OK TO TRANSFUSE Performed at [facility], [address] [place], [place] [address]    Recent Results (from the past 240 hour(s))  Culture, blood (routine x 2)     Status: None (Preliminary result)   Collection Time: 04/14/2020 11:00 PM   Specimen: BLOOD  Result Value Ref Range Status   Specimen Description   Final    BLOOD RIGHT ANTECUBITAL Performed at [facility], [address] [place], [place] [address]    Special Requests   Final    BOTTLES DRAWN AEROBIC AND ANAEROBIC Blood Culture adequate volume Performed at [facility], [address], [place], [place] [address]    Culture   Final    NO GROWTH 3 DAYS Performed at [facility], [address], [place], [place] [address]    Report Status PENDING  Incomplete  Culture, blood (routine x 2)     Status: None (Preliminary result)   Collection Time: 03/25/20 12:00 AM   Specimen: BLOOD  Result Value Ref Range Status   Specimen Description   Final    BLOOD RIGHT ANTECUBITAL Performed at [facility], [address], [place], [place] [address]    Special Requests   Final    BOTTLES DRAWN AEROBIC AND ANAEROBIC Blood Culture adequate volume Performed at [facility], [address] [place], [place] [address]    Culture   Final    NO GROWTH 3 DAYS Performed at [facility], [address], [place], [place] [address]    Report Status PENDING  Incomplete  Urine culture     Status: None   Collection Time:  03/25/20  5:06 AM   Specimen: In/Out Cath Urine  Result Value Ref Range Status   Specimen Description   Final    IN/OUT CATH URINE Performed at [facility], [address], [place], [place] [address]    Special Requests   Final    NONE Performed at [facility], [address],  [place], [place] [address]    Culture   Final    NO GROWTH Performed at [facility], [address], [place], [place] [address]    Report Status 03/26/2020 FINAL  Final  SARS Coronavirus 2 by RT PCR (hospital order, performed in [facility] hospital lab) Nasopharyngeal Urine, Catheterized     Status: None   Collection Time: 03/25/20  6:16 AM   Specimen: Urine, Catheterized; Nasopharyngeal  Result Value Ref Range Status   SARS Coronavirus 2 NEGATIVE NEGATIVE Final    Comment: (NOTE) SARS-CoV-2 target nucleic acids are NOT DETECTED.  The SARS-CoV-2 RNA is generally detectable in upper and lower respiratory specimens during the acute phase of infection. The lowest concentration of SARS-CoV-2 viral copies this assay can detect is 250 copies / mL. A negative result does not preclude SARS-CoV-2 infection and should not be used as the sole basis for treatment or other patient management decisions.  A negative result may occur with improper specimen collection / handling, submission of specimen other than nasopharyngeal swab, presence of viral mutation(s) within the areas targeted by this assay, and inadequate number of viral copies (<250 copies / mL). A negative result must be combined with clinical observations, patient history, and epidemiological information.  Fact Sheet for Patients:   BoilerBrush.com.cy  Fact Sheet for Healthcare Providers: https://pope.com/  This test is not yet approved or  cleared by the [place] FDA and has been authorized for detection and/or diagnosis of SARS-CoV-2 by FDA under an Emergency Use Authorization (EUA).  This EUA will remain in effect (meaning this test can be used) for the duration of the COVID-19 declaration under Section 564(b)(1) of the Act, 21 U.S.C. section 360bbb-3(b)(1), unless the authorization is terminated or revoked sooner.  Performed at [facility], [address], [place], [place] [address]   MRSA PCR Screening     Status: None   Collection Time: 03/25/20  8:53 AM   Specimen: Nasal Mucosa; Nasopharyngeal  Result Value Ref Range Status   MRSA by PCR NEGATIVE NEGATIVE Final    Comment:        The GeneXpert MRSA Assay (FDA approved for NASAL specimens only), is one component of a comprehensive MRSA colonization surveillance program. It is not intended to diagnose MRSA infection nor to guide or monitor treatment for MRSA infections. Performed at [facility], [address], [place], [place] [address]   Body fluid culture (includes gram stain)     Status: None   Collection Time: 03/25/20  1:01 PM   Specimen: Peritoneal Washings; Peritoneal Fluid  Result Value Ref Range Status   Specimen Description   Final    PERITONEAL Performed at [facility], [address], [place], [place] [address]    Special Requests   Final    NONE Performed at [facility], [address] [place], [place] [address]    Gram Stain   Final    WBC PRESENT,BOTH PMN AND MONONUCLEAR NO ORGANISMS SEEN CYTOSPIN SMEAR    Culture   Final    NO GROWTH Performed at [facility], [address] [place], [place] [address]    Report Status 03/28/2020 FINAL  Final  Acid Fast Smear (AFB)     Status: None   Collection Time: 03/26/20  2:49 PM   Specimen: Paracentesis; Abdominal Fluid  Result Value Ref Range Status   AFB Specimen Processing Concentration  Final   Acid Fast Smear Negative  Final    Comment: (NOTE) Performed At: [facility] [address] [place], [place] [id number] Jolene Schimke MD VH:[id number]    Source (AFB) PERITONEAL  Final    Comment: Performed at [facility], [address] [place], [place] [address]    Renal Function: Recent Labs    03/25/20 0214 03/25/20 0619 03/25/20 1700 03/26/20 0414 03/26/20 2009 03/27/20 0509 03/28/20 0500   CREATININE 1.86* 1.67* 1.28* 1.28* 1.26* 1.67* 2.01*   Estimated Creatinine Clearance: 36.9 mL/min (A) (by C-G formula based on SCr of 2.01 mg/dL (H)).  Radiologic Imaging: CT ABDOMEN PELVIS W CONTRAST  Result Date: 03/27/2020 CLINICAL DATA:  Concern for bleeding into bladder by prior ultrasound EXAM: CT ABDOMEN AND PELVIS WITH CONTRAST TECHNIQUE: Multidetector CT imaging of the abdomen and pelvis was performed using the standard protocol following bolus administration of intravenous contrast. CONTRAST:  80mL OMNIPAQUE IOHEXOL 300 MG/ML  SOLN COMPARISON:  03/25/2020 CT.  Renal ultrasound 03/27/2020 FINDINGS: Lower chest: Moderate to large bilateral pleural effusions, increasing since prior study. Compressive atelectasis in the lower lobes. Hepatobiliary: Suggestion of nodularity to the surface of the liver again noted. No focal hepatic abnormality. High-density material seen within the gallbladder could reflect vicarious excretion of contrast, similar prior study. Pancreas: No focal abnormality or ductal dilatation. Spleen: Scattered low-density lesions within the spleen. The largest noted medially measuring 3.2 cm. Spleen is enlarged measuring 13.9 cm. Adrenals/Urinary Tract: No renal or adrenal mass. No hydronephrosis. Delayed imaging through the kidneys demonstrates no significant excretion of contrast into the collecting systems bilaterally. In what appears to be the bladder within the pelvis, there are layering hematocrit levels and fluid levels. This is felt to most likely be blood within the urinary bladder. On the sagittal view, it appears that the Foley catheter is posterior to what is felt represent the bladder. Conceivably, this could be a complex fluid collection/hematoma anterior to a decompressed urinary bladder, but this is difficult to confirm. Stomach/Bowel: Stomach, large and small bowel grossly unremarkable. Vascular/Lymphatic: Aortic atherosclerosis. No enlarged abdominal or pelvic lymph  nodes. Reproductive: No visible focal abnormality. Other: Moderate ascites throughout the abdomen and pelvis. Musculoskeletal: No acute bony abnormality. IMPRESSION: Rounded area with hematocrit/fluid levels in the pelvis felt to most likely be the urinary bladder. It appears that the indwelling Foley catheter is posterior to this fluid collection. This conceivably could reflect a complex fluid collection/hematoma anterior to a decompressed urinary bladder. 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 Areas of low-density within the spleen, nonspecific and stable since prior study. Delayed excretion of contrast from the kidneys into the collecting systems bilaterally. No hydronephrosis. Electronically Signed   By: Charlett Nose M.D.   On: 03/27/2020 17:51   US RENAL  Result Date: 03/27/2020 CLINICAL DATA:  Renal failure. EXAM: RENAL / URINARY TRACT ULTRASOUND COMPLETE COMPARISON:  CT of the abdomen and pelvis on 03/25/2020 FINDINGS: Right Kidney: Renal measurements: 11.1 x 4.5 x 4.4 centimeters = volume: 114.5 mL . Echogenicity within normal limits. No mass or hydronephrosis visualized. Left Kidney: Renal measurements: 10.6 x 5.3 x 4.8 centimeters = volume: 140.1 mL. Echogenicity  within normal limits. No mass or hydronephrosis visualized. Bladder: Heterogeneous material identified within the urinary bladder, consistent with blood clot. A Foley catheter is not identified within the lumen of the urinary bladder. Parallel hyperechoic linear structures are along the posterior aspect of the bladder, raising the question of malposition of the Foley catheter. Other: Ascites. IMPRESSION: 1. No hydronephrosis or renal mass. 2. Large amount of heterogeneous material within the urinary bladder, consistent with blood clot. 3. Foley catheter is not identified within the urinary bladder. There are linear hyperechoic structures along the posterior aspect of the bladder, raising the question of malposition of the  Foley catheter. 4. Ascites. Critical Value/emergent results were called by telephone at the time of interpretation on 03/27/2020 at 1:05 pm to provider Dr. Merry LoftyLaura Paige Clark, who verbally acknowledged these results. Electronically Signed   By: Norva PavlovElizabeth  Brown M.D.   On: 03/27/2020 13:05   I independently reviewed the above imaging studies.   Impression/Recommendation  64 y.o. male with alcoholic hepatitis, ascites and anemia in the setting of shock. Found to have active bleed from epigastric artery. Foley catheter in correct position in bladder, low urine output likely secondary to shock, AKI. Dark urine likely secondary to liver failure.   - Continue foley catheter as needed per primary team, ok for nursing to downsize to 16Fr for patient comfort  Thea AlkenChristine Chazz Philson 03/28/2020, 10:32 AM

## 2020-03-28 NOTE — Progress Notes (Signed)
eLink Physician-Brief Progress Note Patient Name: Cory Aguirre DOB: 02-Dec-1955 MRN: [id number]   Date of Service  03/28/2020  HPI/Events of Note  Notified of lactic acid 3.3, trended down Norepinephrine being weaned down S/p successful embolization of left inf epigastric artery  eICU Interventions  Still has blood products pending transfusion. Awaiting am labs to assess need for further transfusion     Intervention Category Major Interventions: Other:  Darl Pikes 03/28/2020, 6:15 AM

## 2020-03-28 NOTE — Progress Notes (Addendum)
eLink Physician-Brief Progress Note Patient Name: Cory Aguirre DOB: 01/08/1956 MRN: [id number]   Date of Service  03/28/2020  HPI/Events of Note  Abdominal is firm below umbilicus. FFP to be started soon for elevated INR, hematoma s/p Pelvic arteriogram 8/7. On Levo 21. HR 96, 92/60.  Data/Note sen.    eICU Interventions  - Bladder scan for oliguria. Cr 2.2 - follow Hg/INR post FFP, if going down repeat CT abdomen/pelvis. - elevated LA. Has alcohol ascitis/hepatitis. Pancytopenia. Albumin bolus. Had fluid earlier for LA 4.1. Follow LA at 2200.      Intervention Category Intermediate Interventions: Other:  Ranee Gosselin 03/28/2020, 8:24 PM   23;14 Ticket received.  Hg 6.3, MAP softer, needing levo to go up. Camera: Has suprapubic rounded swelling, some echymosis. UOP low. Scan not able to do from this swelling to see any urine. HR ok.   - 1 PRBC - INR stable. Plt 79 improving.  So no ffp. - get CT abd/pelvis w/o contrast. Cr 2.27. - follow LA. Follow H/hct post PRBC.   Discussed with bed side RN.   23:41 US pelvis ordered. Not able to go for CT abdomen/pelvis.

## 2020-03-28 NOTE — Plan of Care (Addendum)
Back on pressors this afternoon. Lactic acid 4.5. Hb improved since this morning. Urine now very dark.  LR bolus to determine if he is volume responsive. Checking UA, urine myoglobin Repeat lactic acid in 3 hrs; hopeful that maintaining adequate perfusion will optimize his peripheral perfusion   Steffanie Dunn, DO 03/28/20 5:02 PM [facility]   No response to fluid bolus. INR increasing slightly. Additional FFP, calcium ordered. Repeat lactate ordered for this evening.  Steffanie Dunn, DO 03/28/20 5:48 PM [facility]   Additional CC time: 32 minutes.

## 2020-03-28 NOTE — Progress Notes (Signed)
Called to assess patient's urine, darker in color today and concern for blood. Irrigated dark cola colored urine at bedside, no return of clot, irrigated to clear yellow. Given low concern for bleeding from bladder, elected to downsize catheter at this time as well. Using sterile technique, placed a 16Fr foley catheter with temperature probe, with 10cc sterile water in the balloon. Patient was having some bladder spasms with irrigation, but urine returned clear yellow. Would anticipate continued intermittent dark urine the setting of liver failure, decreased urine output.   Thea Alken, MD PGY-4 Urology

## 2020-03-28 NOTE — Progress Notes (Signed)
Elink informed re: pt abdominal distention,  discoloration and oliguria.

## 2020-03-28 NOTE — Progress Notes (Signed)
NAME:  Cory Aguirre, MRN:  [id number], DOB:  1956-08-08, LOS: 3 ADMISSION DATE:  05-Apr-2020, CONSULTATION DATE:  03/28/20  REFERRING MD:  Elpidio Anis PA-C CHIEF COMPLAINT:  Sepsis   Brief History   Cory Aguirre is a 64 y.o. male with h/o alcohol abuse who was admitted from the ED after being found hypotensive and altered in his home. Initial BP was 50s/30s, which subsequently responded well to volume resuscitation. He was found to be febrile to 101.5 F. Labs notable for hyponatremia and significantly elevated LFTs and lactic acidosis.    ->prodrome of about 1 week not feeling well; loose dark stools, nausea poor po intake. 70 lb wt loss last 25mo. Syncopal episode on toilet.     Past Medical History  Alcohol abuse  Significant Hospital Events   8/5 admitted. Volume responsive. CVL placed-->started on pressors. Diagnostic paracentesis completed: Admitted w/ sepsis/septic shock working dx source was enterocolitis vs portal enteropathy +/- SBP; IVFs started, cultures sent. abx initiated. fluid was sterile (cell count, G stain and culture all negative for signs of infection) however other than that the fluid test results do not add very much in evaluating for source of the fluid:  SAAG is unable to be calculated accurately because lab reported ascites fluid as <1. The serum albumin at that time was 1.4. Acute hepatitis panel negative. Starting to wonder if this is more acute liver failure in setting of acute alcoholic hepatitis than sepsis.   8/6: HIDA scan completed.  Hemoglobin down to 6.8, transfusing.  CVP 3.  Still volume depleted.  More awake.  Still pressor dependent.  Renal function somewhat improved.  Consults:  PCCM Surg, IR and GI all consulted.   Procedures:  CVC L subclavian Paracentesis 8/6 Irrigating foley 8/7 IR embolization L inferior epigastric artery 8/7   Significant Diagnostic Tests:  CT a/p with contrast (8/5):IMPRESSION:1. Small right greater than left pleural  effusions.2. Suspected liver disease. There is splenomegaly and moderate volume of ascites within the abdomen and pelvis.3. Diffuse fluid within the small bowel. Mild thickened appearanceof left upper quadrant jejunal small bowel loops with mild wallthickening of the ascending colon and hepatic flexure. Findingscould be secondary to enteritis/colitis versus portal enteropathy.4. Hyperdense sludge within the gallbladder. Possible gallbladderwall thickening or edema, correlation with right upper quadrant ultrasound could be obtained as indicated RUQ [place] 8/5: 1. Gallbladder is filled with sludge, thickened wall, and pericholecystic edema. Positive Murphy's sign. Changes may represent acute cholecystitis in the appropriate clinical setting. 2. Heterogeneous liver parenchymal echotexture suggesting cirrhosis or fatty infiltration. 3. Upper abdominal ascites. Acute viral (hep ABC) panel 8/5:  Negative. Ana>>> AMA>>> CMV>>> EBV>>>  Micro Data:  Blood cultures (8/5) pending 8/4 stool culture>>> 8/5 UC: neg 8/5 ascites >>>  Antimicrobials:  Vancomycin (8/5) Cefepime, metronidazole (8/5-)   Interim history/subjective:  Remains confused, making more urine overnight. Awaiting blood products from outside hospitals.  Objective   Blood pressure (!) 95/59, pulse (!) 101, temperature (!) 97.4 F (36.3 C), temperature source Axillary, resp. rate (!) 23, height 5\' 9"  (1.753 m), weight 69.4 kg, SpO2 93 %.        Intake/Output Summary (Last 24 hours) at 03/28/2020 0724 Last data filed at 03/28/2020 0703 Gross per 24 hour  Intake 5812.76 ml  Output 135 ml  Net 5677.76 ml   Filed Weights   03/25/20 0900 03/26/20 0500 03/28/20 0401  Weight: 63.9 kg 68.8 kg 69.4 kg    Examination:  General: chronically ill appearing, laying in  bed no acute distress HEENT: [place]/AT, icteric sclera Neuro: Awake, redirectable, but mildly agitated, pulling on Foley catheter CV: Regular rate and rhythm PULM: Breathing  comfortably on room air, decreased basilar breath sounds, clear anteriorly. Abd: Remains hard over pelvis, soft over the rest of his abdomen. GU: Foley in place Extremities: Pitting lower extremity edema, right femoral access site without bleeding into bandage Skin: Petechiae, no rashes.  Jaundice.   Resolved Hospital Problem list    Syncope: Likely orthostatic versus vasovagal in setting of recent poor PO intake. Assessment & Plan:  Shock w/ severe lactic acidosis, septic complicated by hemorrhagic shock.  Source of sepsis unknown, but improving on antibiotics.  Hemorrhagic shock likely post paracentesis complication, status post embolization.  -Continue broad-spectrum antibiotics -Continue to titrate down norepinephrine; -Will eventually need diuresis, but need to ensure hemodynamic stability and intravascular distention from bleeding first.  Acute blood loss anemia -Serial CBCs -Transfuse to maintain hemoglobin greater than 7 and hemodynamic stability -Having trouble crossmatching units  Acute on chronic liver disease w/ acute alcoholic hepatitis and acute liver failure , decompensated cirrhosis MELD-Na+ = 33 (a/w 65-66% mortality in 90 days) Maddrey index = 55.3 (a/w poor prognosis) -Continue to trend liver function -Avoid hepatotoxic medications -Maintain appropriate perfusion -Supportive care -When appropriate will counsel on the importance on avoiding alcohol in future -Supplemental vitamins  AKI, pre-renal -Volume resuscitation -Strict I's/O -Renally dose meds and avoid nephrotoxic meds -Appreciate urology's assistance ruling out post renal obstruction  Fluid and electrolyte imbalance: hypovolemic hyponatremia--> now hypervolemic -Eventually will require diuresis once stable -Continue to monitor  Acute metabolic encephalopathy: sepsis +/- hepatic  -Lactulose -CIWA protocol -Delirium precautions -Wrist restraints as needed to prevent traumatic removal of lines and  tubes  Thrombocytopenia in setting of liver failure, acute bleeding -Goal platelets greater than 50 given recent significant bleeding -Continue to monitor  Coagulopathy due to decompensated cirrhosis, alcoholic hepatitis, bleeding -transfuse FFP, cryo  Mild hyperglycemia -Sliding-scale insulin protocol -Goal BG 140-180 while mated to the ICU  Best practice:  Diet: NPO until encephalopathy improves Pain/Anxiety/Delirium protocol (if indicated): N/A VAP protocol (if indicated): N/A DVT prophylaxis: SCD GI prophylaxis: PPI Glucose control: SSI Mobility: Bed rest Code Status: Full Code Family Communication: Neldon Labella Disposition: ICU  Labs   CBC: Recent Labs  Lab 04/04/2020 2300 03/21/2020 2300 03/25/20 0506 03/25/20 0506 03/25/20 1640 03/25/20 2101 03/26/20 0831 03/27/20 0509 03/27/20 1600  WBC 5.9   < > 3.0*  --  2.4*  --  1.2* 2.3* 2.7*  NEUTROABS 5.0  --   --   --   --   --  1.0*  --   --   HGB 9.6*   < > 6.5*  --  8.5*  --  6.8* 6.3* 5.8*  HCT 28.2*   < > 19.1*  --  24.0*  --  19.4* 18.0* 16.5*  MCV 92.2   < > 93.2  --  87.9  --  87.4 87.8 88.2  PLT 150   < > 73*   < > 56* 51* 38* 48* 70*   < > = values in this interval not displayed.    Basic Metabolic Panel: Recent Labs  Lab 03/25/20 0506 03/25/20 0619 03/25/20 1700 03/26/20 0414 03/26/20 2009 03/27/20 0509 03/28/20 0500  NA  --    < > 122* 127* 128* 128* 127*  K  --    < > 3.7 3.4* 3.5 3.8 3.7  CL  --    < > 91*  91* 91* 92* 91*  CO2  --    < > 22 26 27 22 23   GLUCOSE  --    < > 117* 156* 82 121* 180*  BUN  --    < > 44* 42* 42* 41* 47*  CREATININE  --    < > 1.28* 1.28* 1.26* 1.67* 2.01*  CALCIUM  --    < > 5.5* 6.0* 5.9* 6.2* 6.5*  MG 2.8*  --   --  2.9*  --  2.5* 2.5*  PHOS 4.6  --   --  2.4*  --  3.2 4.5   < > = values in this interval not displayed.   GFR: Estimated Creatinine Clearance: 36.9 mL/min (A) (by C-G formula based on SCr of 2.01 mg/dL (H)). Recent Labs  Lab  03/25/20 1640 03/25/20 1800 03/26/20 0559 03/26/20 0831 03/27/20 0046 03/27/20 0509 03/27/20 0626 03/27/20 1600 03/28/20 0500  WBC 2.4*  --   --  1.2*  --  2.3*  --  2.7*  --   LATICACIDVEN  --    < > 3.1*  --  4.7*  --  6.6*  --  3.3*   < > = values in this interval not displayed.    Liver Function Tests: Recent Labs  Lab 03/25/20 1700 03/26/20 0414 03/26/20 2009 03/27/20 0509 03/28/20 0500  AST 643* 542* 609* 490* PENDING  ALT 120* 106* 120* 108* 80*  ALKPHOS 300* 232* 248* 219* 144*  BILITOT 10.5* 11.0* 13.3* 12.1* 13.8*  PROT 3.3* 3.5* 3.6* 3.2* 3.6*  ALBUMIN 1.7* 2.0* 2.0* 1.6* 1.9*   Recent Labs  Lab 03/25/20 0506  LIPASE 55*   Recent Labs  Lab 04/07/2020 2300 03/27/20 1600  AMMONIA 45* 53*    ABG No results found for: PHART, PCO2ART, PO2ART, HCO3, TCO2, ACIDBASEDEF, O2SAT   Coagulation Profile: Recent Labs  Lab 03/25/20 2101 03/26/20 0414 03/27/20 0509 03/27/20 1600 03/28/20 0500  INR 2.1* 1.9* 2.3* 1.7* 1.7*      CBG: Recent Labs  Lab 03/27/20 1234 03/27/20 1603 03/27/20 1926 03/27/20 2339 03/28/20 0410  GLUCAP 126* 123* 120* 136* 164*    This patient is critically ill with multiple organ system failure which requires frequent high complexity decision making, assessment, support, evaluation, and titration of therapies. This was completed through the application of advanced monitoring technologies and extensive interpretation of multiple databases. During this encounter critical care time was devoted to patient care services described in this note for 33 minutes.  05/28/20, DO 03/28/20 7:52 AM [facility]

## 2020-03-29 ENCOUNTER — Inpatient Hospital Stay (HOSPITAL_COMMUNITY): Payer: BLUE CROSS/BLUE SHIELD

## 2020-03-29 DIAGNOSIS — E871 Hypo-osmolality and hyponatremia: Secondary | ICD-10-CM | POA: Diagnosis not present

## 2020-03-29 DIAGNOSIS — R6520 Severe sepsis without septic shock: Secondary | ICD-10-CM

## 2020-03-29 DIAGNOSIS — A021 Salmonella sepsis: Secondary | ICD-10-CM

## 2020-03-29 DIAGNOSIS — E872 Acidosis: Secondary | ICD-10-CM

## 2020-03-29 DIAGNOSIS — D689 Coagulation defect, unspecified: Secondary | ICD-10-CM | POA: Diagnosis not present

## 2020-03-29 DIAGNOSIS — E7220 Disorder of urea cycle metabolism, unspecified: Secondary | ICD-10-CM

## 2020-03-29 DIAGNOSIS — K7041 Alcoholic hepatic failure with coma: Secondary | ICD-10-CM

## 2020-03-29 DIAGNOSIS — D65 Disseminated intravascular coagulation [defibrination syndrome]: Secondary | ICD-10-CM

## 2020-03-29 DIAGNOSIS — G9341 Metabolic encephalopathy: Secondary | ICD-10-CM | POA: Diagnosis not present

## 2020-03-29 LAB — BPAM FFP
Blood Product Expiration Date: 202108102359
Blood Product Expiration Date: 202108102359
Blood Product Expiration Date: 202108102359
Blood Product Expiration Date: 202108122359
Blood Product Expiration Date: 202108122359
Blood Product Expiration Date: 202108122359
Blood Product Expiration Date: 202108122359
Blood Product Expiration Date: 202108132359
Blood Product Expiration Date: 202108132359
ISSUE DATE / TIME: 202108051414
ISSUE DATE / TIME: 202108051746
ISSUE DATE / TIME: 202108060151
ISSUE DATE / TIME: 202108071235
ISSUE DATE / TIME: 202108071414
ISSUE DATE / TIME: 202108071949
ISSUE DATE / TIME: 202108080148
ISSUE DATE / TIME: 202108081027
ISSUE DATE / TIME: 202108082014
Unit Type and Rh: 6200
Unit Type and Rh: 6200
Unit Type and Rh: 6200
Unit Type and Rh: 6200
Unit Type and Rh: 6200
Unit Type and Rh: 6200
Unit Type and Rh: 6200
Unit Type and Rh: 6200
Unit Type and Rh: 6200

## 2020-03-29 LAB — PREPARE CRYOPRECIPITATE
Unit division: 0
Unit division: 0
Unit division: 0
Unit division: 0

## 2020-03-29 LAB — PREPARE FRESH FROZEN PLASMA
Unit division: 0
Unit division: 0
Unit division: 0
Unit division: 0
Unit division: 0
Unit division: 0
Unit division: 0
Unit division: 0

## 2020-03-29 LAB — BPAM RBC
Blood Product Expiration Date: 202108232359
Blood Product Expiration Date: 202108232359
Blood Product Expiration Date: 202108242359
Blood Product Expiration Date: 202108242359
Blood Product Expiration Date: 202108262359
Blood Product Expiration Date: 202108302359
Blood Product Expiration Date: 202108302359
Blood Product Expiration Date: 202108312359
ISSUE DATE / TIME: 202108050739
ISSUE DATE / TIME: 202108051107
ISSUE DATE / TIME: 202108061408
ISSUE DATE / TIME: 202108070819
ISSUE DATE / TIME: 202108071758
ISSUE DATE / TIME: 202108072253
ISSUE DATE / TIME: 202108080642
ISSUE DATE / TIME: 202108082333
Unit Type and Rh: 6200
Unit Type and Rh: 6200
Unit Type and Rh: 6200
Unit Type and Rh: 6200
Unit Type and Rh: 6200
Unit Type and Rh: 6200
Unit Type and Rh: 6200
Unit Type and Rh: 6200

## 2020-03-29 LAB — CBC WITH DIFFERENTIAL/PLATELET
Abs Immature Granulocytes: 0.1 10*3/uL — ABNORMAL HIGH (ref 0.00–0.07)
Basophils Absolute: 0 10*3/uL (ref 0.0–0.1)
Basophils Relative: 0 %
Eosinophils Absolute: 0 10*3/uL (ref 0.0–0.5)
Eosinophils Relative: 0 %
HCT: 22 % — ABNORMAL LOW (ref 39.0–52.0)
Hemoglobin: 7.5 g/dL — ABNORMAL LOW (ref 13.0–17.0)
Immature Granulocytes: 2 %
Lymphocytes Relative: 8 %
Lymphs Abs: 0.4 10*3/uL — ABNORMAL LOW (ref 0.7–4.0)
MCH: 31 pg (ref 26.0–34.0)
MCHC: 34.1 g/dL (ref 30.0–36.0)
MCV: 90.9 fL (ref 80.0–100.0)
Monocytes Absolute: 0.6 10*3/uL (ref 0.1–1.0)
Monocytes Relative: 12 %
Neutro Abs: 3.9 10*3/uL (ref 1.7–7.7)
Neutrophils Relative %: 78 %
Platelets: 84 10*3/uL — ABNORMAL LOW (ref 150–400)
RBC: 2.42 MIL/uL — ABNORMAL LOW (ref 4.22–5.81)
RDW: 16.8 % — ABNORMAL HIGH (ref 11.5–15.5)
WBC: 5 10*3/uL (ref 4.0–10.5)
nRBC: 0.6 % — ABNORMAL HIGH (ref 0.0–0.2)

## 2020-03-29 LAB — PREPARE PLATELET PHERESIS
Unit division: 0
Unit division: 0
Unit division: 0
Unit division: 0

## 2020-03-29 LAB — LACTIC ACID, PLASMA
Lactic Acid, Venous: 7 mmol/L (ref 0.5–1.9)
Lactic Acid, Venous: 9 mmol/L (ref 0.5–1.9)

## 2020-03-29 LAB — BPAM CRYOPRECIPITATE
Blood Product Expiration Date: 202108061333
Blood Product Expiration Date: 202108080740
Blood Product Expiration Date: 202108081630
Blood Product Expiration Date: 202108091241
ISSUE DATE / TIME: 202108060801
ISSUE DATE / TIME: 202108080344
ISSUE DATE / TIME: 202108081535
Unit Type and Rh: 6200
Unit Type and Rh: 6200
Unit Type and Rh: 6200
Unit Type and Rh: 6200

## 2020-03-29 LAB — TYPE AND SCREEN
ABO/RH(D): A POS
Antibody Screen: NEGATIVE
Unit division: 0
Unit division: 0
Unit division: 0
Unit division: 0
Unit division: 0
Unit division: 0
Unit division: 0
Unit division: 0

## 2020-03-29 LAB — COMPREHENSIVE METABOLIC PANEL
ALT: 94 U/L — ABNORMAL HIGH (ref 0–44)
AST: 310 U/L — ABNORMAL HIGH (ref 15–41)
Albumin: 2.3 g/dL — ABNORMAL LOW (ref 3.5–5.0)
Alkaline Phosphatase: 165 U/L — ABNORMAL HIGH (ref 38–126)
Anion gap: 21 — ABNORMAL HIGH (ref 5–15)
BUN: 55 mg/dL — ABNORMAL HIGH (ref 8–23)
CO2: 14 mmol/L — ABNORMAL LOW (ref 22–32)
Calcium: 6.2 mg/dL — CL (ref 8.9–10.3)
Chloride: 93 mmol/L — ABNORMAL LOW (ref 98–111)
Creatinine, Ser: 2.38 mg/dL — ABNORMAL HIGH (ref 0.61–1.24)
GFR calc Af Amer: 32 mL/min — ABNORMAL LOW (ref >60)
GFR calc non Af Amer: 28 mL/min — ABNORMAL LOW (ref >60)
Glucose, Bld: 105 mg/dL — ABNORMAL HIGH (ref 70–99)
Potassium: 3.7 mmol/L (ref 3.5–5.1)
Sodium: 128 mmol/L — ABNORMAL LOW (ref 135–145)
Total Bilirubin: 16.2 mg/dL — ABNORMAL HIGH (ref 0.3–1.2)
Total Protein: 4.1 g/dL — ABNORMAL LOW (ref 6.5–8.1)

## 2020-03-29 LAB — PREPARE RBC (CROSSMATCH)

## 2020-03-29 LAB — BPAM PLATELET PHERESIS
Blood Product Expiration Date: 202108082359
Blood Product Expiration Date: 202108082359
Blood Product Expiration Date: 202108092359
Blood Product Expiration Date: 202108092359
ISSUE DATE / TIME: 202108071413
ISSUE DATE / TIME: 202108072103
ISSUE DATE / TIME: 202108081027
ISSUE DATE / TIME: 202108081534
Unit Type and Rh: 5100
Unit Type and Rh: 7300
Unit Type and Rh: 6200
Unit Type and Rh: 6200

## 2020-03-29 LAB — PROTIME-INR
INR: 1.9 — ABNORMAL HIGH (ref 0.8–1.2)
INR: 2 — ABNORMAL HIGH (ref 0.8–1.2)
Prothrombin Time: 21.5 seconds — ABNORMAL HIGH (ref 11.4–15.2)
Prothrombin Time: 21.7 seconds — ABNORMAL HIGH (ref 11.4–15.2)

## 2020-03-29 LAB — BLOOD GAS, VENOUS
Acid-base deficit: 12.5 mmol/L — ABNORMAL HIGH (ref 0.0–2.0)
Bicarbonate: 11.4 mmol/L — ABNORMAL LOW (ref 20.0–28.0)
O2 Saturation: 69.6 %
Patient temperature: 98.6
pCO2, Ven: 18.9 mmHg — CL (ref 44.0–60.0)
pH, Ven: 7.398 (ref 7.250–7.430)
pO2, Ven: 39.9 mmHg (ref 32.0–45.0)

## 2020-03-29 LAB — MISC LABCORP TEST (SEND OUT): Labcorp test code: 19588

## 2020-03-29 LAB — GLUCOSE, CAPILLARY
Glucose-Capillary: 93 mg/dL (ref 70–99)
Glucose-Capillary: 84 mg/dL (ref 70–99)

## 2020-03-29 LAB — FIBRINOGEN: Fibrinogen: 217 mg/dL (ref 210–475)

## 2020-03-29 LAB — MAGNESIUM: Magnesium: 2.6 mg/dL — ABNORMAL HIGH (ref 1.7–2.4)

## 2020-03-29 LAB — ANTINUCLEAR ANTIBODIES, IFA: ANA Ab, IFA: NEGATIVE

## 2020-03-29 LAB — CYTOLOGY - NON PAP

## 2020-03-29 LAB — PHOSPHORUS: Phosphorus: 5.5 mg/dL — ABNORMAL HIGH (ref 2.5–4.6)

## 2020-03-29 MED ORDER — MORPHINE SULFATE (PF) 2 MG/ML IV SOLN: 2.0000 mg | INTRAVENOUS | Status: DC | PRN

## 2020-03-29 MED ORDER — DEXMEDETOMIDINE HCL IN NACL 200 MCG/50ML IV SOLN
0.2000 ug/kg/h | INTRAVENOUS | Status: DC
  Administered 2020-03-29: 0.2 ug/kg/h via INTRAVENOUS
  Administered 2020-03-29 (×2): 0.4 ug/kg/h via INTRAVENOUS
  Administered 2020-03-29: 0.2 ug/kg/h via INTRAVENOUS
  Filled 2020-03-29 (×3): qty 50

## 2020-03-29 MED ORDER — MORPHINE SULFATE (PF) 2 MG/ML IV SOLN
2.0000 mg | INTRAVENOUS | Status: DC | PRN
  Administered 2020-03-29: 2 mg via INTRAVENOUS
  Administered 2020-03-29: 4 mg via INTRAVENOUS
  Filled 2020-03-29: qty 2
  Filled 2020-03-29: qty 1

## 2020-03-29 MED ORDER — MORPHINE 100MG IN NS 100ML (1MG/ML) PREMIX INFUSION
0.0000 mg/h | INTRAVENOUS | Status: DC
  Administered 2020-03-29: 5 mg/h via INTRAVENOUS
  Administered 2020-03-30: 7 mg/h via INTRAVENOUS
  Filled 2020-03-29 (×3): qty 100

## 2020-03-29 MED ORDER — SODIUM CHLORIDE 0.9% IV SOLUTION: Freq: Once | INTRAVENOUS | Status: DC

## 2020-03-29 MED ORDER — DIPHENHYDRAMINE HCL 50 MG/ML IJ SOLN: 25.0000 mg | INTRAMUSCULAR | Status: DC | PRN

## 2020-03-29 MED ORDER — NOREPINEPHRINE 16 MG/250ML-% IV SOLN
0.0000 ug/min | INTRAVENOUS | Status: DC
  Administered 2020-03-29: 40 ug/min via INTRAVENOUS
  Filled 2020-03-29 (×3): qty 250

## 2020-03-29 MED ORDER — GLYCOPYRROLATE 0.2 MG/ML IJ SOLN: 0.2000 mg | INTRAMUSCULAR | Status: DC | PRN

## 2020-03-29 MED ORDER — DEXTROSE 5 % IV SOLN: INTRAVENOUS | Status: DC

## 2020-03-29 MED ORDER — LACTATED RINGERS IV BOLUS
500.0000 mL | Freq: Once | INTRAVENOUS | Status: AC
  Administered 2020-03-29: 500 mL via INTRAVENOUS

## 2020-03-29 MED ORDER — GLYCOPYRROLATE 1 MG PO TABS: 1.0000 mg | ORAL_TABLET | ORAL | Status: DC | PRN

## 2020-03-29 MED ORDER — FENTANYL CITRATE (PF) 100 MCG/2ML IJ SOLN: 12.5000 ug | INTRAMUSCULAR | Status: DC | PRN

## 2020-03-29 MED ORDER — MIDAZOLAM HCL 2 MG/2ML IJ SOLN
2.0000 mg | INTRAMUSCULAR | Status: DC | PRN
  Filled 2020-03-29: qty 4

## 2020-03-29 MED ORDER — VASOPRESSIN 20 UNITS/100 ML INFUSION FOR SHOCK
0.0000 [IU]/min | INTRAVENOUS | Status: DC
  Administered 2020-03-29 (×2): 0.03 [IU]/min via INTRAVENOUS
  Filled 2020-03-29 (×2): qty 100

## 2020-03-29 MED ORDER — MORPHINE BOLUS VIA INFUSION
5.0000 mg | INTRAVENOUS | Status: DC | PRN
  Administered 2020-03-29: 5 mg via INTRAVENOUS
  Filled 2020-03-29: qty 5

## 2020-03-29 MED ORDER — MORPHINE SULFATE (PF) 2 MG/ML IV SOLN
1.0000 mg | INTRAVENOUS | Status: DC | PRN
  Administered 2020-03-29: 2 mg via INTRAVENOUS
  Filled 2020-03-29: qty 1

## 2020-03-29 MED ORDER — POLYVINYL ALCOHOL 1.4 % OP SOLN
1.0000 [drp] | Freq: Four times a day (QID) | OPHTHALMIC | Status: DC | PRN
  Filled 2020-03-29: qty 15

## 2020-03-29 NOTE — Progress Notes (Signed)
NAME:  Cory Aguirre, MRN:  [id number], DOB:  [birth year], LOS: 4 ADMISSION DATE:  03/29/2020, CONSULTATION DATE:  03/29/20  REFERRING MD:  Elpidio Anis PA-C CHIEF COMPLAINT:  Sepsis   Brief History   Cory Aguirre is a 64 y.o. male with h/o alcohol abuse who was admitted from the ED after being found hypotensive and altered in his home. Initial BP was 50s/30s, which subsequently responded well to volume resuscitation. He was found to be febrile to 101.5 F. Labs notable for hyponatremia and significantly elevated LFTs and lactic acidosis.    ->prodrome of about 1 week not feeling well; loose dark stools, nausea poor po intake. 70 lb wt loss last 47mo. Syncopal episode on toilet.     Past Medical History  Alcohol abuse  Significant Hospital Events   8/5 admitted. Volume responsive. CVL placed-->started on pressors. Diagnostic paracentesis completed: Admitted w/ sepsis/septic shock working dx source was enterocolitis vs portal enteropathy +/- SBP; IVFs started, cultures sent. abx initiated. fluid was sterile (cell count, G stain and culture all negative for signs of infection) however other than that the fluid test results do not add very much in evaluating for source of the fluid:  SAAG is unable to be calculated accurately because lab reported ascites fluid as <1. The serum albumin at that time was 1.4. Acute hepatitis panel negative. Starting to wonder if this is more acute liver failure in setting of acute alcoholic hepatitis than sepsis.   8/6: HIDA scan completed.  Hemoglobin down to 6.8, transfusing.  CVP 3.  Still volume depleted.  More awake.  Still pressor dependent.  Renal function somewhat improved. 8/7 new shock, hgb dropped. CT w/ large pelvic hematoma. underwent arteriogram and coil embolization on 8/7 repeat CT scan 8/9 showing worsening pelvic hematoma; received more blood 8/9 hgb dropping again. CT abd/pelvis w/ increased size of pelvic clott. On high dose pressors; actively dying.    Consults:  PCCM Surg, IR and GI all consulted.   Procedures:  CVC L subclavian Paracentesis 8/6 Irrigating foley 8/7 IR embolization L inferior epigastric artery 8/7   Significant Diagnostic Tests:  CT a/p with contrast (8/5):IMPRESSION:1. Small right greater than left pleural effusions.2. Suspected liver disease. There is splenomegaly and moderate volume of ascites within the abdomen and pelvis.3. Diffuse fluid within the small bowel. Mild thickened appearanceof left upper quadrant jejunal small bowel loops with mild wallthickening of the ascending colon and hepatic flexure. Findingscould be secondary to enteritis/colitis versus portal enteropathy.4. Hyperdense sludge within the gallbladder. Possible gallbladderwall thickening or edema, correlation with right upper quadrant ultrasound could be obtained as indicated RUQ [place] 8/5: 1. Gallbladder is filled with sludge, thickened wall, and pericholecystic edema. Positive Murphy's sign. Changes may represent acute cholecystitis in the appropriate clinical setting. 2. Heterogeneous liver parenchymal echotexture suggesting cirrhosis or fatty infiltration. 3. Upper abdominal ascites. Acute viral (hep ABC) panel 8/5:  Negative. Ana>>> AMA>>> CMV>>>neg EBV>>>neg  Micro Data:  Blood cultures (8/5) pending 8/4 stool culture>>> 8/5 UC: neg 8/5 ascites >>>neg  Antimicrobials:  Vancomycin (8/5) Cefepime, metronidazole (8/5-)   Interim history/subjective:  Intermittent Agonal resp status   Objective   Blood pressure 111/88, pulse (Abnormal) 132, temperature 97.9 F (36.6 C), resp. rate (Abnormal) 37, height 5\' 9"  (1.753 m), weight 69.4 kg, SpO2 99 %.        Intake/Output Summary (Last 24 hours) at 03/29/2020 1010 Last data filed at 03/29/2020 0600 Gross per 24 hour  Intake 4295.22 ml  Output 220 ml  Net 4075.22 ml   Filed Weights   03/25/20 0900 03/26/20 0500 03/28/20 0401  Weight: 63.9 kg 68.8 kg 69.4 kg    Examination:  General  terminally ill white male w/ agonal respiratory pattern HENT sclera icteric. MM dry. Neck veins flat Pulm labored w/ episodes of agonal effort. Currently on [place] Card tachy rrr  abd soft. Firm over bladder. diffusely ecchymotic over abd hypoactive  Ext diffuse anasarca pulses palp warm to touch  GU bloody urine from fc  Neuro minimally responsive.   Resolved Hospital Problem list    Syncope: Likely orthostatic versus vasovagal in setting of recent poor PO intake. Assessment & Plan:  Shock w/ severe lactic acidosis, septic (NOS) complicated by hemorrhagic shock.   Source of sepsis unknown, but improving on antibiotics.  Hemorrhagic shock 2/2 spontaneous active bleeding from epigastric artery -->the last paracentesis was performed on RLQ; non-bloody so do not think paracentesis had anything to do w/ the bleeding.  -underwent arteriogram and coil embolization on 8/7 repeat CT scan 8/9 showing worsening pelvic hematoma.  Plan Cont current pressors (for now) Keep euvolemic  No further transfusion  Acute blood loss anemia 2/2 spontaneously bleed from LEFT epigastric artery in setting of acute liver failure.  -hgb dropped once again to 6.3; got more blood. Now at 7.5 Plan Holding further transfusions I spoke to IR. They are willing to bring back for repeat angiogram but would need CT angio as well. I do not think he is going to survive no matter what we do and given his coagulopathy there would be no guarantee he would not spontaneously bleed from another site. I will speak to sister when she arrives. I think it is time to transition to comfort.    Acute on chronic liver disease w/ acute alcoholic hepatitis and acute liver failure , decompensated cirrhosis MELD-Na+ = 33 (a/w 65-66% mortality in 90 days) Maddrey index = 55.3 (a/w poor prognosis) Plan Avoiding hepatotoxins Maximize perfusion Would rx w/ 4 wks of pred but cannot take POs Supportive care   AKI w/ worsening metabolic  acidosis -there was concern about blood clot in bladder. Seen by Urology they think the hematoma is primarily in the pelvis  Plan Keep foley in place Renal dose meds stict I&O   Fluid and electrolyte imbalance: hypovolemic hyponatremia--> now hypervolemic Plan Trending chemistries as appropriate.  Will likely dc this if we transition to full comfort  Acute metabolic encephalopathy: sepsis +/- hepatic  Plan Lactulose supportive care  Thrombocytopenia in setting of liver failure, acute bleeding Plan plt goal > 50; for now    Coagulopathy due to decompensated cirrhosis, alcoholic hepatitis, bleeding Plan Cont IV vit K No more tranfusions at this point  Mild hyperglycemia Plan ssi protocol   Best practice:  Diet: NPO until encephalopathy improves Pain/Anxiety/Delirium protocol (if indicated): N/A VAP protocol (if indicated): N/A DVT prophylaxis: SCD GI prophylaxis: PPI Glucose control: SSI Mobility: Bed rest Code Status: Full Code Family Communication: Neldon Labella Disposition: ICU  Labs   CBC: Recent Labs  Lab 04-10-20 2300 03/25/20 0506 03/26/20 0831 03/27/20 0509 03/27/20 1600 03/28/20 0500 03/28/20 1338 03/28/20 2232 03/29/20 0416  WBC 5.9   < > 1.2*   < > 2.7* 1.7* 2.8* 4.5 5.0  NEUTROABS 5.0  --  1.0*  --   --  1.3*  --   --  3.9  HGB 9.6*   < > 6.8*   < > 5.8* 6.4* 7.4* 6.3* 7.5*  HCT 28.2*   < >  19.4*   < > 16.5* 18.1* 20.5* 17.4* 22.0*  MCV 92.2   < > 87.4   < > 88.2 85.0 85.1 86.6 90.9  PLT 150   < > 38*   < > 70* 43* 65* 79* 84*   < > = values in this interval not displayed.    Basic Metabolic Panel: Recent Labs  Lab 03/25/20 0506 03/25/20 0619 03/26/20 0414 03/26/20 0414 03/26/20 2009 03/27/20 0509 03/28/20 0500 03/28/20 1540 03/29/20 0418  NA  --    < > 127*   < > 128* 128* 127* 133* 128*  K  --    < > 3.4*   < > 3.5 3.8 3.7 3.6 3.7  CL  --    < > 91*   < > 91* 92* 91* 95* 93*  CO2  --    < > 26   < > 27 22 23 25  14*   GLUCOSE  --    < > 156*   < > 82 121* 180* 132* 105*  BUN  --    < > 42*   < > 42* 41* 47* 52* 55*  CREATININE  --    < > 1.28*   < > 1.26* 1.67* 2.01* 2.23* 2.38*  CALCIUM  --    < > 6.0*   < > 5.9* 6.2* 6.5* 6.7* 6.2*  MG 2.8*  --  2.9*  --   --  2.5* 2.5*  --  2.6*  PHOS 4.6  --  2.4*  --   --  3.2 4.5  --  5.5*   < > = values in this interval not displayed.   GFR: Estimated Creatinine Clearance: 31.2 mL/min (A) (by C-G formula based on SCr of 2.38 mg/dL (H)). Recent Labs  Lab 03/28/20 0500 03/28/20 0500 03/28/20 1338 03/28/20 1540 03/28/20 1813 03/28/20 2232 03/28/20 2325 03/29/20 0416 03/29/20 0418  WBC 1.7*  --  2.8*  --   --  4.5  --  5.0  --   LATICACIDVEN 3.3*   < >  --  4.5* 6.0*  --  7.0*  --  9.0*   < > = values in this interval not displayed.    Liver Function Tests: Recent Labs  Lab 03/26/20 0414 03/26/20 2009 03/27/20 0509 03/28/20 0500 03/29/20 0418  AST 542* 609* 490* 290* 310*  ALT 106* 120* 108* 80* 94*  ALKPHOS 232* 248* 219* 144* 165*  BILITOT 11.0* 13.3* 12.1* 13.8* 16.2*  PROT 3.5* 3.6* 3.2* 3.6* 4.1*  ALBUMIN 2.0* 2.0* 1.6* 1.9* 2.3*   Recent Labs  Lab 03/25/20 0506  LIPASE 55*   Recent Labs  Lab 04-16-20 2300 03/27/20 1600  AMMONIA 45* 53*    ABG    Component Value Date/Time   HCO3 11.4 (L) 03/29/2020 0155   ACIDBASEDEF 12.5 (H) 03/29/2020 0155   O2SAT 69.6 03/29/2020 0155     Coagulation Profile: Recent Labs  Lab 03/28/20 0500 03/28/20 1338 03/28/20 2232 03/29/20 0416 03/29/20 0418  INR 1.7* 1.8* 1.7* 2.0* 1.9*      CBG: Recent Labs  Lab 03/28/20 1531 03/28/20 2011 03/28/20 2335 03/29/20 0334 03/29/20 0742  GLUCAP 121* 121* 105* 93 84   My cct 40 minutes  05/29/20 ACNP-BC [facility] Pager # [phone] OR # [phone] if no answer

## 2020-03-29 NOTE — Progress Notes (Signed)
[facility] Progress Note  CC:  Alcoholic hepatitis, elevated LFTs  Subjective: Patient is unresponsive. Friend at bed side. Patient is DNR/DNI. He is on comfort care, remains on Levophed and Vasopressin. I spoke to the patient's RN Ruby, plans to withdrawal care after family members arrive.    Objective:  Vital signs in last 24 hours: Temp:  [96.8 F (36 C)-98.7 F (37.1 C)] 97.9 F (36.6 C) (08/09 0600) Pulse Rate:  [91-136] 132 (08/09 0600) Resp:  [23-48] 37 (08/09 0600) BP: (75-119)/(45-88) 111/88 (08/09 0600) SpO2:  [85 %-100 %] 99 % (08/09 0600) Last BM Date: 03/28/20  General:   Critically ill appearing male, nonresponsive.  Eyes: Moderate scleral icterus. Pupils pinpoint nonreactive.  Heart: Tachycardic, no murmur.  Pulm:  Breath sound coarse, diminished in the bases.  Abdomen: Soft, slightly distended, not tense. No BS auscultated. No hepatomegaly.  Extremities:  1 to 2+ pitting edema.  Neurologic:  Unresponsive on Precedex.  Psych: No signs of agitation on Precedex.   Intake/Output from previous day: 08/08 0701 - 08/09 0700 In: 4705.2 [I.V.:2481.6; Blood:1830.7; IV Piggyback:393] Out: 220 [Urine:220] Intake/Output this shift: No intake/output data recorded.  Lab Results: Recent Labs    03/28/20 1338 03/28/20 2232 03/29/20 0416  WBC 2.8* 4.5 5.0  HGB 7.4* 6.3* 7.5*  HCT 20.5* 17.4* 22.0*  PLT 65* 79* 84*   BMET Recent Labs    03/28/20 0500 03/28/20 1540 03/29/20 0418  NA 127* 133* 128*  K 3.7 3.6 3.7  CL 91* 95* 93*  CO2 23 25 14*  GLUCOSE 180* 132* 105*  BUN 47* 52* 55*  CREATININE 2.01* 2.23* 2.38*  CALCIUM 6.5* 6.7* 6.2*   LFT Recent Labs    03/29/20 0418  PROT 4.1*  ALBUMIN 2.3*  AST 310*  ALT 94*  ALKPHOS 165*  BILITOT 16.2*   PT/INR Recent Labs    03/29/20 0416 03/29/20 0418  LABPROT 21.7* 21.5*  INR 2.0* 1.9*   Hepatitis Panel No results for input(s): HEPBSAG, HCVAB, HEPAIGM, HEPBIGM in the last 72  hours.  CT ABDOMEN PELVIS WO CONTRAST  Result Date: 03/29/2020 CLINICAL DATA:  Anemia and shock EXAM: CT ABDOMEN AND PELVIS WITHOUT CONTRAST TECHNIQUE: Multidetector CT imaging of the abdomen and pelvis was performed following the standard protocol without IV contrast. COMPARISON:  None. FINDINGS: LOWER CHEST: Large pleural effusions with basilar atelectasis. HEPATOBILIARY: Large amount of ascites. There is vicarious excretion of contrast in the gallbladder. PANCREAS: Normal pancreas. No ductal dilatation or peripancreatic fluid collection. SPLEEN: Spleen is enlarged, measuring 15.0 cm in AP dimension. ADRENALS/URINARY TRACT: The adrenal glands are normal. No hydronephrosis, nephroureterolithiasis or solid renal mass. There is a large hematoma within the anterior pelvis likely within the urinary bladder. There is a Foley catheter at the dependent aspect of this collection. Collection is increased in size. Delayed excretion of contrast from both kidneys. STOMACH/BOWEL: There is no hiatal hernia. Normal duodenal course and caliber. No small bowel dilatation or inflammation. No focal colonic abnormality. VASCULAR/LYMPHATIC: There is calcific atherosclerosis of the abdominal aorta. No lymphadenopathy. REPRODUCTIVE: There are calcifications within the normal-sized prostate. Symmetric seminal vesicles. MUSCULOSKELETAL. No bony spinal canal stenosis or focal osseous abnormality. OTHER: Anasarca. Intra-abdominal blood tracks into both inguinal canals. IMPRESSION: 1. Large hematoma within the anterior pelvis, likely within the urinary bladder. Collection has increased in size. 2. Large amount of ascites, large pleural effusions and anasarca. 3. Aortic Atherosclerosis (ICD10-I70.0). Electronically Signed   By: Ulyses Jarred M.D.   On:  03/29/2020 03:22   CT ABDOMEN PELVIS W CONTRAST  Result Date: 03/27/2020 CLINICAL DATA:  Concern for bleeding into bladder by prior ultrasound EXAM: CT ABDOMEN AND PELVIS WITH CONTRAST  TECHNIQUE: Multidetector CT imaging of the abdomen and pelvis was performed using the standard protocol following bolus administration of intravenous contrast. CONTRAST:  36m OMNIPAQUE IOHEXOL 300 MG/ML  SOLN COMPARISON:  03/25/2020 CT.  Renal ultrasound 03/27/2020 FINDINGS: Lower chest: Moderate to large bilateral pleural effusions, increasing since prior study. Compressive atelectasis in the lower lobes. Hepatobiliary: Suggestion of nodularity to the surface of the liver again noted. No focal hepatic abnormality. High-density material seen within the gallbladder could reflect vicarious excretion of contrast, similar prior study. Pancreas: No focal abnormality or ductal dilatation. Spleen: Scattered low-density lesions within the spleen. The largest noted medially measuring 3.2 cm. Spleen is enlarged measuring 13.9 cm. Adrenals/Urinary Tract: No renal or adrenal mass. No hydronephrosis. Delayed imaging through the kidneys demonstrates no significant excretion of contrast into the collecting systems bilaterally. In what appears to be the bladder within the pelvis, there are layering hematocrit levels and fluid levels. This is felt to most likely be blood within the urinary bladder. On the sagittal view, it appears that the Foley catheter is posterior to what is felt represent the bladder. Conceivably, this could be a complex fluid collection/hematoma anterior to a decompressed urinary bladder, but this is difficult to confirm. Stomach/Bowel: Stomach, large and small bowel grossly unremarkable. Vascular/Lymphatic: Aortic atherosclerosis. No enlarged abdominal or pelvic lymph nodes. Reproductive: No visible focal abnormality. Other: Moderate ascites throughout the abdomen and pelvis. Musculoskeletal: No acute bony abnormality. IMPRESSION: Rounded area with hematocrit/fluid levels in the pelvis felt to most likely be the urinary bladder. It appears that the indwelling Foley catheter is posterior to this fluid  collection. This conceivably could reflect a complex fluid collection/hematoma anterior to a decompressed urinary bladder. 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 Areas of low-density within the spleen, nonspecific and stable since prior study. Delayed excretion of contrast from the kidneys into the collecting systems bilaterally. No hydronephrosis. Electronically Signed   By: KRolm BaptiseM.D.   On: 03/27/2020 17:51   IR Angiogram Pelvis Selective Or Supraselective  Result Date: 03/28/2020 INDICATION: 64year old with acute hepatitis, hypotension and active bleeding in the anterior pelvis/lower abdomen. Plan for pelvic angiography and embolization. EXAM: 1. Bilateral pelvic angiography: Bilateral external iliac arteries and selective angiography of the left inferior epigastric artery and left inferior epigastric artery branch 2. Coil embolization left inferior epigastric artery branch 3. Ultrasound guidance for vascular access MEDICATIONS: None ANESTHESIA/SEDATION: None Patient was monitored by radiology and ICU nurse throughout the procedure. CONTRAST:  82 mL Omnipaque 300 FLUOROSCOPY TIME:  Fluoroscopy Time: 10 minutes, 6 seconds, 13500mGy COMPLICATIONS: None immediate. PROCEDURE: Informed consent was obtained from the patient's sister ([facility] following explanation of the procedure, risks, benefits and alternatives. Patient has altered mental status. The patient's POA understands, agrees and consents for the procedure. All questions were addressed. A time out was performed prior to the initiation of the procedure. Ultrasound confirmed a patent right common femoral artery. Ultrasound image was saved for documentation. Both groins were prepped and draped in sterile fashion. Maximal barrier sterile technique was utilized including caps, mask, sterile gowns, sterile gloves, sterile drape, hand hygiene and skin antiseptic. Right groin was anesthetized with 1% lidocaine. Small incision was made.  Using ultrasound guidance, 21 gauge needle was directed into the right common femoral artery and micropuncture dilator set was placed. 5  [place] vascular sheath was placed over a Bentson wire. Angiogram was performed through the right groin sheath to evaluate the right external iliac artery and right common femoral artery. C2 catheter was used to cannulate the left common iliac artery and advanced to the left external iliac artery. Angiography was performed in the left external iliac artery. Focus of active extravasation was identified from branch of the left inferior epigastric artery. STC microcatheter was advanced into the left inferior epigastric artery trunk and into the branch feeding the bleeding site. Selective angiography was obtained in this feeding arterial branch. The feeding branch was embolized using two 1 mm x 2 cm Ruby LP coils and one 2 mm x 4 cm Ruby LP coil. Follow-up angiography was performed. Microcatheter and C2 catheter were removed. An Omni flush catheter was placed in the right external iliac artery and selective angiography was performed. Omni Flush catheter was removed. Injection was performed through the right groin sheath. The right groin sheath was removed using Angio-Seal closure device. Right groin hemostasis after deployment of the Angio-Seal device. Bandage placed over the puncture site. FINDINGS: Left external iliac artery is patent. The left common femoral artery, proximal left femoral arteries are patent. A small focus of active bleeding coming off a proximal branch of the left inferior epigastric artery corresponds with the area of contrast extravasation on the recent CT. Microcatheter was advanced into the vessel feeding the arterial bleed. Catheter could not be advanced distal to the bleeding site but was placed just proximal to the area of bleeding for coil embolization. Feeding branch was successfully embolized with 3 coils. No evidence for active extravasation at the end of  the procedure. The left inferior epigastric artery remained patent after the embolization. Right common femoral artery and proximal right femoral arteries are patent. Right external iliac artery injection demonstrated no active extravasation from the right inferior epigastric artery. IMPRESSION: Successful coil embolization of a left inferior epigastric artery branch that was actively bleeding. No evidence for active bleeding after the embolization. Electronically Signed   By: Markus Daft M.D.   On: 03/28/2020 13:09   IR Angiogram Selective Each Additional Vessel  Result Date: 03/28/2020 INDICATION: 64 year old with acute hepatitis, hypotension and active bleeding in the anterior pelvis/lower abdomen. Plan for pelvic angiography and embolization. EXAM: 1. Bilateral pelvic angiography: Bilateral external iliac arteries and selective angiography of the left inferior epigastric artery and left inferior epigastric artery branch 2. Coil embolization left inferior epigastric artery branch 3. Ultrasound guidance for vascular access MEDICATIONS: None ANESTHESIA/SEDATION: None Patient was monitored by radiology and ICU nurse throughout the procedure. CONTRAST:  82 mL Omnipaque 300 FLUOROSCOPY TIME:  Fluoroscopy Time: 10 minutes, 6 seconds, 5732 mGy COMPLICATIONS: None immediate. PROCEDURE: Informed consent was obtained from the patient's sister [facility]) following explanation of the procedure, risks, benefits and alternatives. Patient has altered mental status. The patient's POA understands, agrees and consents for the procedure. All questions were addressed. A time out was performed prior to the initiation of the procedure. Ultrasound confirmed a patent right common femoral artery. Ultrasound image was saved for documentation. Both groins were prepped and draped in sterile fashion. Maximal barrier sterile technique was utilized including caps, mask, sterile gowns, sterile gloves, sterile drape, hand hygiene and skin  antiseptic. Right groin was anesthetized with 1% lidocaine. Small incision was made. Using ultrasound guidance, 21 gauge needle was directed into the right common femoral artery and micropuncture dilator set was placed. 5 [place] vascular sheath was placed over a Bentson  wire. Angiogram was performed through the right groin sheath to evaluate the right external iliac artery and right common femoral artery. C2 catheter was used to cannulate the left common iliac artery and advanced to the left external iliac artery. Angiography was performed in the left external iliac artery. Focus of active extravasation was identified from branch of the left inferior epigastric artery. STC microcatheter was advanced into the left inferior epigastric artery trunk and into the branch feeding the bleeding site. Selective angiography was obtained in this feeding arterial branch. The feeding branch was embolized using two 1 mm x 2 cm Ruby LP coils and one 2 mm x 4 cm Ruby LP coil. Follow-up angiography was performed. Microcatheter and C2 catheter were removed. An Omni flush catheter was placed in the right external iliac artery and selective angiography was performed. Omni Flush catheter was removed. Injection was performed through the right groin sheath. The right groin sheath was removed using Angio-Seal closure device. Right groin hemostasis after deployment of the Angio-Seal device. Bandage placed over the puncture site. FINDINGS: Left external iliac artery is patent. The left common femoral artery, proximal left femoral arteries are patent. A small focus of active bleeding coming off a proximal branch of the left inferior epigastric artery corresponds with the area of contrast extravasation on the recent CT. Microcatheter was advanced into the vessel feeding the arterial bleed. Catheter could not be advanced distal to the bleeding site but was placed just proximal to the area of bleeding for coil embolization. Feeding branch was  successfully embolized with 3 coils. No evidence for active extravasation at the end of the procedure. The left inferior epigastric artery remained patent after the embolization. Right common femoral artery and proximal right femoral arteries are patent. Right external iliac artery injection demonstrated no active extravasation from the right inferior epigastric artery. IMPRESSION: Successful coil embolization of a left inferior epigastric artery branch that was actively bleeding. No evidence for active bleeding after the embolization. Electronically Signed   By: Markus Daft M.D.   On: 03/28/2020 13:09   IR Angiogram Selective Each Additional Vessel  Result Date: 03/28/2020 INDICATION: 64 year old with acute hepatitis, hypotension and active bleeding in the anterior pelvis/lower abdomen. Plan for pelvic angiography and embolization. EXAM: 1. Bilateral pelvic angiography: Bilateral external iliac arteries and selective angiography of the left inferior epigastric artery and left inferior epigastric artery branch 2. Coil embolization left inferior epigastric artery branch 3. Ultrasound guidance for vascular access MEDICATIONS: None ANESTHESIA/SEDATION: None Patient was monitored by radiology and ICU nurse throughout the procedure. CONTRAST:  82 mL Omnipaque 300 FLUOROSCOPY TIME:  Fluoroscopy Time: 10 minutes, 6 seconds, 9562 mGy COMPLICATIONS: None immediate. PROCEDURE: Informed consent was obtained from the patient's sister [facility]) following explanation of the procedure, risks, benefits and alternatives. Patient has altered mental status. The patient's POA understands, agrees and consents for the procedure. All questions were addressed. A time out was performed prior to the initiation of the procedure. Ultrasound confirmed a patent right common femoral artery. Ultrasound image was saved for documentation. Both groins were prepped and draped in sterile fashion. Maximal barrier sterile technique was utilized including  caps, mask, sterile gowns, sterile gloves, sterile drape, hand hygiene and skin antiseptic. Right groin was anesthetized with 1% lidocaine. Small incision was made. Using ultrasound guidance, 21 gauge needle was directed into the right common femoral artery and micropuncture dilator set was placed. 5 [place] vascular sheath was placed over a Bentson wire. Angiogram was performed through the right groin  sheath to evaluate the right external iliac artery and right common femoral artery. C2 catheter was used to cannulate the left common iliac artery and advanced to the left external iliac artery. Angiography was performed in the left external iliac artery. Focus of active extravasation was identified from branch of the left inferior epigastric artery. STC microcatheter was advanced into the left inferior epigastric artery trunk and into the branch feeding the bleeding site. Selective angiography was obtained in this feeding arterial branch. The feeding branch was embolized using two 1 mm x 2 cm Ruby LP coils and one 2 mm x 4 cm Ruby LP coil. Follow-up angiography was performed. Microcatheter and C2 catheter were removed. An Omni flush catheter was placed in the right external iliac artery and selective angiography was performed. Omni Flush catheter was removed. Injection was performed through the right groin sheath. The right groin sheath was removed using Angio-Seal closure device. Right groin hemostasis after deployment of the Angio-Seal device. Bandage placed over the puncture site. FINDINGS: Left external iliac artery is patent. The left common femoral artery, proximal left femoral arteries are patent. A small focus of active bleeding coming off a proximal branch of the left inferior epigastric artery corresponds with the area of contrast extravasation on the recent CT. Microcatheter was advanced into the vessel feeding the arterial bleed. Catheter could not be advanced distal to the bleeding site but was placed  just proximal to the area of bleeding for coil embolization. Feeding branch was successfully embolized with 3 coils. No evidence for active extravasation at the end of the procedure. The left inferior epigastric artery remained patent after the embolization. Right common femoral artery and proximal right femoral arteries are patent. Right external iliac artery injection demonstrated no active extravasation from the right inferior epigastric artery. IMPRESSION: Successful coil embolization of a left inferior epigastric artery branch that was actively bleeding. No evidence for active bleeding after the embolization. Electronically Signed   By: Markus Daft M.D.   On: 03/28/2020 13:09   US RENAL  Result Date: 03/27/2020 CLINICAL DATA:  Renal failure. EXAM: RENAL / URINARY TRACT ULTRASOUND COMPLETE COMPARISON:  CT of the abdomen and pelvis on 03/25/2020 FINDINGS: Right Kidney: Renal measurements: 11.1 x 4.5 x 4.4 centimeters = volume: 114.5 mL . Echogenicity within normal limits. No mass or hydronephrosis visualized. Left Kidney: Renal measurements: 10.6 x 5.3 x 4.8 centimeters = volume: 140.1 mL. Echogenicity within normal limits. No mass or hydronephrosis visualized. Bladder: Heterogeneous material identified within the urinary bladder, consistent with blood clot. A Foley catheter is not identified within the lumen of the urinary bladder. Parallel hyperechoic linear structures are along the posterior aspect of the bladder, raising the question of malposition of the Foley catheter. Other: Ascites. IMPRESSION: 1. No hydronephrosis or renal mass. 2. Large amount of heterogeneous material within the urinary bladder, consistent with blood clot. 3. Foley catheter is not identified within the urinary bladder. There are linear hyperechoic structures along the posterior aspect of the bladder, raising the question of malposition of the Foley catheter. 4. Ascites. Critical Value/emergent results were called by telephone at the  time of interpretation on 03/27/2020 at 1:05 pm to provider Dr. Nichola Sizer, who verbally acknowledged these results. Electronically Signed   By: Nolon Nations M.D.   On: 03/27/2020 13:05   US Abdomen Limited  Result Date: 03/29/2020 CLINICAL DATA:  64 year old male with ascites, suspected cirrhosis on recent CT Abdomen and Pelvis. EXAM: LIMITED ABDOMEN ULTRASOUND FOR ASCITES TECHNIQUE: Limited  ultrasound survey for ascites was performed in all four abdominal quadrants. COMPARISON:  CT Abdomen and Pelvis 03/27/2020. FINDINGS: Small volume ascites with small volume of free fluid demonstrated in all 4 quadrants. Nodular liver surface on image 2. Highly abnormal region of the bladder again noted, with complex collection not demonstrating vascular elements on brief color Doppler (image 18). IMPRESSION: 1. Small volume of ascites, stable from the CT 03/27/2020. 2. Nodular liver contour compatible with Cirrhosis. 3. Complex collection within or adjacent to the urinary bladder again noted in the pelvis. Electronically Signed   By: Genevie Ann M.D.   On: 03/29/2020 00:45   IR US Guide Vasc Access Right  Result Date: 03/28/2020 INDICATION: 64 year old with acute hepatitis, hypotension and active bleeding in the anterior pelvis/lower abdomen. Plan for pelvic angiography and embolization. EXAM: 1. Bilateral pelvic angiography: Bilateral external iliac arteries and selective angiography of the left inferior epigastric artery and left inferior epigastric artery branch 2. Coil embolization left inferior epigastric artery branch 3. Ultrasound guidance for vascular access MEDICATIONS: None ANESTHESIA/SEDATION: None Patient was monitored by radiology and ICU nurse throughout the procedure. CONTRAST:  82 mL Omnipaque 300 FLUOROSCOPY TIME:  Fluoroscopy Time: 10 minutes, 6 seconds, 4098 mGy COMPLICATIONS: None immediate. PROCEDURE: Informed consent was obtained from the patient's sister [facility]) following explanation of the  procedure, risks, benefits and alternatives. Patient has altered mental status. The patient's POA understands, agrees and consents for the procedure. All questions were addressed. A time out was performed prior to the initiation of the procedure. Ultrasound confirmed a patent right common femoral artery. Ultrasound image was saved for documentation. Both groins were prepped and draped in sterile fashion. Maximal barrier sterile technique was utilized including caps, mask, sterile gowns, sterile gloves, sterile drape, hand hygiene and skin antiseptic. Right groin was anesthetized with 1% lidocaine. Small incision was made. Using ultrasound guidance, 21 gauge needle was directed into the right common femoral artery and micropuncture dilator set was placed. 5 [place] vascular sheath was placed over a Bentson wire. Angiogram was performed through the right groin sheath to evaluate the right external iliac artery and right common femoral artery. C2 catheter was used to cannulate the left common iliac artery and advanced to the left external iliac artery. Angiography was performed in the left external iliac artery. Focus of active extravasation was identified from branch of the left inferior epigastric artery. STC microcatheter was advanced into the left inferior epigastric artery trunk and into the branch feeding the bleeding site. Selective angiography was obtained in this feeding arterial branch. The feeding branch was embolized using two 1 mm x 2 cm Ruby LP coils and one 2 mm x 4 cm Ruby LP coil. Follow-up angiography was performed. Microcatheter and C2 catheter were removed. An Omni flush catheter was placed in the right external iliac artery and selective angiography was performed. Omni Flush catheter was removed. Injection was performed through the right groin sheath. The right groin sheath was removed using Angio-Seal closure device. Right groin hemostasis after deployment of the Angio-Seal device. Bandage placed  over the puncture site. FINDINGS: Left external iliac artery is patent. The left common femoral artery, proximal left femoral arteries are patent. A small focus of active bleeding coming off a proximal branch of the left inferior epigastric artery corresponds with the area of contrast extravasation on the recent CT. Microcatheter was advanced into the vessel feeding the arterial bleed. Catheter could not be advanced distal to the bleeding site but was placed just proximal to  the area of bleeding for coil embolization. Feeding branch was successfully embolized with 3 coils. No evidence for active extravasation at the end of the procedure. The left inferior epigastric artery remained patent after the embolization. Right common femoral artery and proximal right femoral arteries are patent. Right external iliac artery injection demonstrated no active extravasation from the right inferior epigastric artery. IMPRESSION: Successful coil embolization of a left inferior epigastric artery branch that was actively bleeding. No evidence for active bleeding after the embolization. Electronically Signed   By: Markus Daft M.D.   On: 03/28/2020 13:09   DG Chest Port 1 View  Result Date: 03/28/2020 CLINICAL DATA:  Abnormal respiration EXAM: PORTABLE CHEST 1 VIEW COMPARISON:  03/25/2020 FINDINGS: Layering moderate left pleural effusion. Small right pleural effusion. Associated left lower lobe opacity, likely atelectasis. These findings may be mildly progressive from the prior. No pneumothorax. Left subclavian venous catheter terminating at the cavoatrial junction. The heart is normal in size. IMPRESSION: Moderate left and small right pleural effusions, mildly progressive. Left lower lobe opacity, likely atelectasis. Electronically Signed   By: Julian Hy M.D.   On: 03/28/2020 04:16   IR EMBO ART  VEN HEMORR LYMPH EXTRAV  INC GUIDE ROADMAPPING  Result Date: 03/28/2020 INDICATION: 64 year old with acute hepatitis,  hypotension and active bleeding in the anterior pelvis/lower abdomen. Plan for pelvic angiography and embolization. EXAM: 1. Bilateral pelvic angiography: Bilateral external iliac arteries and selective angiography of the left inferior epigastric artery and left inferior epigastric artery branch 2. Coil embolization left inferior epigastric artery branch 3. Ultrasound guidance for vascular access MEDICATIONS: None ANESTHESIA/SEDATION: None Patient was monitored by radiology and ICU nurse throughout the procedure. CONTRAST:  82 mL Omnipaque 300 FLUOROSCOPY TIME:  Fluoroscopy Time: 10 minutes, 6 seconds, 1950 mGy COMPLICATIONS: None immediate. PROCEDURE: Informed consent was obtained from the patient's sister [facility]) following explanation of the procedure, risks, benefits and alternatives. Patient has altered mental status. The patient's POA understands, agrees and consents for the procedure. All questions were addressed. A time out was performed prior to the initiation of the procedure. Ultrasound confirmed a patent right common femoral artery. Ultrasound image was saved for documentation. Both groins were prepped and draped in sterile fashion. Maximal barrier sterile technique was utilized including caps, mask, sterile gowns, sterile gloves, sterile drape, hand hygiene and skin antiseptic. Right groin was anesthetized with 1% lidocaine. Small incision was made. Using ultrasound guidance, 21 gauge needle was directed into the right common femoral artery and micropuncture dilator set was placed. 5 [place] vascular sheath was placed over a Bentson wire. Angiogram was performed through the right groin sheath to evaluate the right external iliac artery and right common femoral artery. C2 catheter was used to cannulate the left common iliac artery and advanced to the left external iliac artery. Angiography was performed in the left external iliac artery. Focus of active extravasation was identified from branch of the left  inferior epigastric artery. STC microcatheter was advanced into the left inferior epigastric artery trunk and into the branch feeding the bleeding site. Selective angiography was obtained in this feeding arterial branch. The feeding branch was embolized using two 1 mm x 2 cm Ruby LP coils and one 2 mm x 4 cm Ruby LP coil. Follow-up angiography was performed. Microcatheter and C2 catheter were removed. An Omni flush catheter was placed in the right external iliac artery and selective angiography was performed. Omni Flush catheter was removed. Injection was performed through the right groin sheath. The right  groin sheath was removed using Angio-Seal closure device. Right groin hemostasis after deployment of the Angio-Seal device. Bandage placed over the puncture site. FINDINGS: Left external iliac artery is patent. The left common femoral artery, proximal left femoral arteries are patent. A small focus of active bleeding coming off a proximal branch of the left inferior epigastric artery corresponds with the area of contrast extravasation on the recent CT. Microcatheter was advanced into the vessel feeding the arterial bleed. Catheter could not be advanced distal to the bleeding site but was placed just proximal to the area of bleeding for coil embolization. Feeding branch was successfully embolized with 3 coils. No evidence for active extravasation at the end of the procedure. The left inferior epigastric artery remained patent after the embolization. Right common femoral artery and proximal right femoral arteries are patent. Right external iliac artery injection demonstrated no active extravasation from the right inferior epigastric artery. IMPRESSION: Successful coil embolization of a left inferior epigastric artery branch that was actively bleeding. No evidence for active bleeding after the embolization. Electronically Signed   By: Markus Daft M.D.   On: 03/28/2020 13:09    Assessment / Plan:  15. 64 year old  male with acute alcoholic hepatitis on chronic liver disease (most likely cirrhosis) + shock liver in setting of severe hypotension and septic shock. Admission Ap 509. AST 465. ALT < 5. T. Bili 12.1. Lactic acid 9.1. CTAP 8/5 identified suspected liver dz, splenomegaly, moderate ascites, sludge in the gallbladder. S/ P paracentesis 8/5 and 8/6. No SBP. SAAG > 1.1 consistent with portal HTN/cirrhosis. Prednisolone 77m QD started on 8/8. Today Alk phos 165. AST 310. ALT 94. T. Bili 16.2. Remains on Vasopressin and Levophed.  -Patient is DNR/DNI, comfort measures implemented with plans to withdrawal care after family members arrive.  -Further recommendations per Dr. PHilarie Fredrickson   2. Hyponatremia secondary to liver disease. Na+ 1536   3. Metabolic/hepatic encephalopathy. Received Lactulose enemas.   4. Acute anemia secondary to intra abdominal/pelvic bleed. Hg 8.5 -> 6.8. Transfused additional 2 units of PRBCs on 8/7. S/P coil emobolization to the left inferior epigastric artery by IR on 8/7. RUQ sono today again notes complex fluid collection within/adjacent to the urinary bladder. CTAP today shows a large hematoma within the anterior pelvis which has increased in size. No plans for repeat IR intervention.   5. Coagulopathy secondary to liver disease, ? DIC.  Received 9 units of FFP. Vitamin K. Today INR 1.9.   6. Pancytopenia secondary to liver disease. He has received a total of 8 units of PRBCs, 4 packs of platelets and cryoprecipitate x 3 since admission.   7. Acute renal failure.  Urine Na+ < 10. Rising creatine. Today Cr. 2.38.   8. Anasarca     Active Problems:   Hyponatremia   Elevated LFTs   Hyperammonemia (HCC)   Sepsis (HCC)   Septic shock (HCC)   Metabolic acidosis   Acute metabolic encephalopathy   Coagulopathy (HCC)   Liver failure without hepatic coma ([place]     LOS: 4 days   CPatrecia PourKennedy-Smith  03/29/2020, 09:00 AM

## 2020-03-29 NOTE — Progress Notes (Signed)
°  Family ask earlier to move to withdrawal of pressors, CCM notified orders received.  After further discussion with family, request to wait until other family arrived.  Still waiting on brother to arrive ... sister ask to slowly wear pressors, patient appears comfortable with shallow resp., SATS good on 4 lither [place], Systolic B/P in 80's all afternoon.

## 2020-03-29 NOTE — Plan of Care (Signed)
Discussed with Cory Aguirre family, healthcare power of attorney [name]. They elected to not pursue intubation and instead will move towards comfort care. They are coming in this morning to pursue comfort care. We will continue supporting him with pressors until they are able to be here.    CODE STATUS DNR/DN

## 2020-03-29 NOTE — Progress Notes (Addendum)
eLink Physician-Brief Progress Note Patient Name: Cory Aguirre DOB: Oct 06, 1955 MRN: [id number]   Date of Service  03/29/2020  HPI/Events of Note  Camera: MAP < 65, tachy at 130, sats 96%. On nasal. Confused.  Talked to Urology Intern: Asked to call IR.  Looks like going into DIC. Actively still bleeding. Ascitis, anasarca, third spacing. AGMA. Aneuric AKI/ATN  eICU Interventions  - discussed with Dr Coy Saunas  Getting DIC panel. Need intubation , hco3 low at 11, VBG pco2 rising.   Will call IR.      Intervention Category Major Interventions: Acid-Base disturbance - evaluation and management;Hemorrhage - evaluation and management  Ranee Gosselin 03/29/2020, 4:13 AM   4:38 Dr Coy Saunas called back. Wife saying he is DNR, d onto intubate , she is calling family opinion. Meantime talked to [employer]. Too risky to move to suite though. Will get Surgery opinion. IR will try in AM, doubt has multiple areas bleed.  Transfuse 2 PRBC/cryo pending DIC work up.  Start Vasopressin for shock- should do some splanchnic vasoconstriction.

## 2020-03-29 NOTE — Plan of Care (Signed)
Patient has poor mental status and needing intubation.  Discussed the recommendation for intubation with his sister and POA Cory Aguirre.  I explained that Cory Aguirre is probably going to tire out, and would recommend intubating now before he got worse, if this would be consistent with his wishes.  Cory Aguirre informed me that Cory Aguirre had actually signed DNR paperwork, and does not want CPR in the event of cardiac arrest.  I explained that with intubating Cory Aguirre, he would be at high risk of cardiac arrest because of his profound shock.  She said that "he probably would not want all of this."  She is going to talk with the rest of her family members and get back to [place].  Holding off on intubation at the moment.     Code status changed from Full to DNR based on previously signed DNR form.

## 2020-03-29 NOTE — Progress Notes (Signed)
Subjective/Chief Complaint: Patient remains unresponsive.  Friend is at bedside.  Called about enlarging hematoma and needing additional blood products early this morning.   Objective: Vital signs in last 24 hours: Temp:  [96.8 F (36 C)-98.7 F (37.1 C)] 98.1 F (36.7 C) (08/09 1025) Pulse Rate:  [91-136] 122 (08/09 1025) Resp:  [25-48] 33 (08/09 1025) BP: (75-119)/(45-88) 87/63 (08/09 1025) SpO2:  [85 %-100 %] 96 % (08/09 1025) Last BM Date: 03/28/20  Intake/Output from previous day: 08/08 0701 - 08/09 0700 In: 4705.2 [I.V.:2481.6; Blood:1830.7; IV Piggyback:393] Out: 220 [Urine:220] Intake/Output this shift: No intake/output data recorded.  PE: General: jaundice and unresponsive Abdomen:  Increased distention in lower anterior abdomen and pelvis.  Increased bruising in both flanks. Extremities: Right groin dressing is intact.  No evidence for hematoma at this location.  Palpable right DP pulse.  Lab Results:  Recent Labs    03/28/20 2232 03/29/20 0416  WBC 4.5 5.0  HGB 6.3* 7.5*  HCT 17.4* 22.0*  PLT 79* 84*   BMET Recent Labs    03/28/20 1540 03/29/20 0418  NA 133* 128*  K 3.6 3.7  CL 95* 93*  CO2 25 14*  GLUCOSE 132* 105*  BUN 52* 55*  CREATININE 2.23* 2.38*  CALCIUM 6.7* 6.2*   PT/INR Recent Labs    03/29/20 0416 03/29/20 0418  LABPROT 21.7* 21.5*  INR 2.0* 1.9*   ABG Recent Labs    03/29/20 0155  HCO3 11.4*    Studies/Results: CT ABDOMEN PELVIS WO CONTRAST  Result Date: 03/29/2020 CLINICAL DATA:  Anemia and shock EXAM: CT ABDOMEN AND PELVIS WITHOUT CONTRAST TECHNIQUE: Multidetector CT imaging of the abdomen and pelvis was performed following the standard protocol without IV contrast. COMPARISON:  None. FINDINGS: LOWER CHEST: Large pleural effusions with basilar atelectasis. HEPATOBILIARY: Large amount of ascites. There is vicarious excretion of contrast in the gallbladder. PANCREAS: Normal pancreas. No ductal dilatation or  peripancreatic fluid collection. SPLEEN: Spleen is enlarged, measuring 15.0 cm in AP dimension. ADRENALS/URINARY TRACT: The adrenal glands are normal. No hydronephrosis, nephroureterolithiasis or solid renal mass. There is a large hematoma within the anterior pelvis likely within the urinary bladder. There is a Foley catheter at the dependent aspect of this collection. Collection is increased in size. Delayed excretion of contrast from both kidneys. STOMACH/BOWEL: There is no hiatal hernia. Normal duodenal course and caliber. No small bowel dilatation or inflammation. No focal colonic abnormality. VASCULAR/LYMPHATIC: There is calcific atherosclerosis of the abdominal aorta. No lymphadenopathy. REPRODUCTIVE: There are calcifications within the normal-sized prostate. Symmetric seminal vesicles. MUSCULOSKELETAL. No bony spinal canal stenosis or focal osseous abnormality. OTHER: Anasarca. Intra-abdominal blood tracks into both inguinal canals. IMPRESSION: 1. Large hematoma within the anterior pelvis, likely within the urinary bladder. Collection has increased in size. 2. Large amount of ascites, large pleural effusions and anasarca. 3. Aortic Atherosclerosis (ICD10-I70.0). Electronically Signed   By: Deatra Robinson M.D.   On: 03/29/2020 03:22   CT ABDOMEN PELVIS W CONTRAST  Result Date: 03/27/2020 CLINICAL DATA:  Concern for bleeding into bladder by prior ultrasound EXAM: CT ABDOMEN AND PELVIS WITH CONTRAST TECHNIQUE: Multidetector CT imaging of the abdomen and pelvis was performed using the standard protocol following bolus administration of intravenous contrast. CONTRAST:  80mL OMNIPAQUE IOHEXOL 300 MG/ML  SOLN COMPARISON:  03/25/2020 CT.  Renal ultrasound 03/27/2020 FINDINGS: Lower chest: Moderate to large bilateral pleural effusions, increasing since prior study. Compressive atelectasis in the lower lobes. Hepatobiliary: Suggestion of nodularity to the surface of the liver again  noted. No focal hepatic  abnormality. High-density material seen within the gallbladder could reflect vicarious excretion of contrast, similar prior study. Pancreas: No focal abnormality or ductal dilatation. Spleen: Scattered low-density lesions within the spleen. The largest noted medially measuring 3.2 cm. Spleen is enlarged measuring 13.9 cm. Adrenals/Urinary Tract: No renal or adrenal mass. No hydronephrosis. Delayed imaging through the kidneys demonstrates no significant excretion of contrast into the collecting systems bilaterally. In what appears to be the bladder within the pelvis, there are layering hematocrit levels and fluid levels. This is felt to most likely be blood within the urinary bladder. On the sagittal view, it appears that the Foley catheter is posterior to what is felt represent the bladder. Conceivably, this could be a complex fluid collection/hematoma anterior to a decompressed urinary bladder, but this is difficult to confirm. Stomach/Bowel: Stomach, large and small bowel grossly unremarkable. Vascular/Lymphatic: Aortic atherosclerosis. No enlarged abdominal or pelvic lymph nodes. Reproductive: No visible focal abnormality. Other: Moderate ascites throughout the abdomen and pelvis. Musculoskeletal: No acute bony abnormality. IMPRESSION: Rounded area with hematocrit/fluid levels in the pelvis felt to most likely be the urinary bladder. It appears that the indwelling Foley catheter is posterior to this fluid collection. This conceivably could reflect a complex fluid collection/hematoma anterior to a decompressed urinary bladder. 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 Areas of low-density within the spleen, nonspecific and stable since prior study. Delayed excretion of contrast from the kidneys into the collecting systems bilaterally. No hydronephrosis. Electronically Signed   By: Charlett Nose M.D.   On: 03/27/2020 17:51   IR Angiogram Pelvis Selective Or Supraselective  Result Date:  03/28/2020 INDICATION: 64 year old with acute hepatitis, hypotension and active bleeding in the anterior pelvis/lower abdomen. Plan for pelvic angiography and embolization. EXAM: 1. Bilateral pelvic angiography: Bilateral external iliac arteries and selective angiography of the left inferior epigastric artery and left inferior epigastric artery branch 2. Coil embolization left inferior epigastric artery branch 3. Ultrasound guidance for vascular access MEDICATIONS: None ANESTHESIA/SEDATION: None Patient was monitored by radiology and ICU nurse throughout the procedure. CONTRAST:  82 mL Omnipaque 300 FLUOROSCOPY TIME:  Fluoroscopy Time: 10 minutes, 6 seconds, 1194 mGy COMPLICATIONS: None immediate. PROCEDURE: Informed consent was obtained from the patient's sister [facility]) following explanation of the procedure, risks, benefits and alternatives. Patient has altered mental status. The patient's POA understands, agrees and consents for the procedure. All questions were addressed. A time out was performed prior to the initiation of the procedure. Ultrasound confirmed a patent right common femoral artery. Ultrasound image was saved for documentation. Both groins were prepped and draped in sterile fashion. Maximal barrier sterile technique was utilized including caps, mask, sterile gowns, sterile gloves, sterile drape, hand hygiene and skin antiseptic. Right groin was anesthetized with 1% lidocaine. Small incision was made. Using ultrasound guidance, 21 gauge needle was directed into the right common femoral artery and micropuncture dilator set was placed. 5 [place] vascular sheath was placed over a Bentson wire. Angiogram was performed through the right groin sheath to evaluate the right external iliac artery and right common femoral artery. C2 catheter was used to cannulate the left common iliac artery and advanced to the left external iliac artery. Angiography was performed in the left external iliac artery. Focus of active  extravasation was identified from branch of the left inferior epigastric artery. STC microcatheter was advanced into the left inferior epigastric artery trunk and into the branch feeding the bleeding site. Selective angiography was obtained in this feeding arterial branch.  The feeding branch was embolized using two 1 mm x 2 cm Ruby LP coils and one 2 mm x 4 cm Ruby LP coil. Follow-up angiography was performed. Microcatheter and C2 catheter were removed. An Omni flush catheter was placed in the right external iliac artery and selective angiography was performed. Omni Flush catheter was removed. Injection was performed through the right groin sheath. The right groin sheath was removed using Angio-Seal closure device. Right groin hemostasis after deployment of the Angio-Seal device. Bandage placed over the puncture site. FINDINGS: Left external iliac artery is patent. The left common femoral artery, proximal left femoral arteries are patent. A small focus of active bleeding coming off a proximal branch of the left inferior epigastric artery corresponds with the area of contrast extravasation on the recent CT. Microcatheter was advanced into the vessel feeding the arterial bleed. Catheter could not be advanced distal to the bleeding site but was placed just proximal to the area of bleeding for coil embolization. Feeding branch was successfully embolized with 3 coils. No evidence for active extravasation at the end of the procedure. The left inferior epigastric artery remained patent after the embolization. Right common femoral artery and proximal right femoral arteries are patent. Right external iliac artery injection demonstrated no active extravasation from the right inferior epigastric artery. IMPRESSION: Successful coil embolization of a left inferior epigastric artery branch that was actively bleeding. No evidence for active bleeding after the embolization. Electronically Signed   By: Richarda Overlie M.D.   On:  03/28/2020 13:09   IR Angiogram Selective Each Additional Vessel  Result Date: 03/28/2020 INDICATION: 64 year old with acute hepatitis, hypotension and active bleeding in the anterior pelvis/lower abdomen. Plan for pelvic angiography and embolization. EXAM: 1. Bilateral pelvic angiography: Bilateral external iliac arteries and selective angiography of the left inferior epigastric artery and left inferior epigastric artery branch 2. Coil embolization left inferior epigastric artery branch 3. Ultrasound guidance for vascular access MEDICATIONS: None ANESTHESIA/SEDATION: None Patient was monitored by radiology and ICU nurse throughout the procedure. CONTRAST:  82 mL Omnipaque 300 FLUOROSCOPY TIME:  Fluoroscopy Time: 10 minutes, 6 seconds, 1194 mGy COMPLICATIONS: None immediate. PROCEDURE: Informed consent was obtained from the patient's sister [facility]) following explanation of the procedure, risks, benefits and alternatives. Patient has altered mental status. The patient's POA understands, agrees and consents for the procedure. All questions were addressed. A time out was performed prior to the initiation of the procedure. Ultrasound confirmed a patent right common femoral artery. Ultrasound image was saved for documentation. Both groins were prepped and draped in sterile fashion. Maximal barrier sterile technique was utilized including caps, mask, sterile gowns, sterile gloves, sterile drape, hand hygiene and skin antiseptic. Right groin was anesthetized with 1% lidocaine. Small incision was made. Using ultrasound guidance, 21 gauge needle was directed into the right common femoral artery and micropuncture dilator set was placed. 5 [place] vascular sheath was placed over a Bentson wire. Angiogram was performed through the right groin sheath to evaluate the right external iliac artery and right common femoral artery. C2 catheter was used to cannulate the left common iliac artery and advanced to the left external iliac  artery. Angiography was performed in the left external iliac artery. Focus of active extravasation was identified from branch of the left inferior epigastric artery. STC microcatheter was advanced into the left inferior epigastric artery trunk and into the branch feeding the bleeding site. Selective angiography was obtained in this feeding arterial branch. The feeding branch was embolized using two 1  mm x 2 cm Ruby LP coils and one 2 mm x 4 cm Ruby LP coil. Follow-up angiography was performed. Microcatheter and C2 catheter were removed. An Omni flush catheter was placed in the right external iliac artery and selective angiography was performed. Omni Flush catheter was removed. Injection was performed through the right groin sheath. The right groin sheath was removed using Angio-Seal closure device. Right groin hemostasis after deployment of the Angio-Seal device. Bandage placed over the puncture site. FINDINGS: Left external iliac artery is patent. The left common femoral artery, proximal left femoral arteries are patent. A small focus of active bleeding coming off a proximal branch of the left inferior epigastric artery corresponds with the area of contrast extravasation on the recent CT. Microcatheter was advanced into the vessel feeding the arterial bleed. Catheter could not be advanced distal to the bleeding site but was placed just proximal to the area of bleeding for coil embolization. Feeding branch was successfully embolized with 3 coils. No evidence for active extravasation at the end of the procedure. The left inferior epigastric artery remained patent after the embolization. Right common femoral artery and proximal right femoral arteries are patent. Right external iliac artery injection demonstrated no active extravasation from the right inferior epigastric artery. IMPRESSION: Successful coil embolization of a left inferior epigastric artery branch that was actively bleeding. No evidence for active  bleeding after the embolization. Electronically Signed   By: Richarda Overlie M.D.   On: 03/28/2020 13:09   IR Angiogram Selective Each Additional Vessel  Result Date: 03/28/2020 INDICATION: 64 year old with acute hepatitis, hypotension and active bleeding in the anterior pelvis/lower abdomen. Plan for pelvic angiography and embolization. EXAM: 1. Bilateral pelvic angiography: Bilateral external iliac arteries and selective angiography of the left inferior epigastric artery and left inferior epigastric artery branch 2. Coil embolization left inferior epigastric artery branch 3. Ultrasound guidance for vascular access MEDICATIONS: None ANESTHESIA/SEDATION: None Patient was monitored by radiology and ICU nurse throughout the procedure. CONTRAST:  82 mL Omnipaque 300 FLUOROSCOPY TIME:  Fluoroscopy Time: 10 minutes, 6 seconds, 1194 mGy COMPLICATIONS: None immediate. PROCEDURE: Informed consent was obtained from the patient's sister [facility]) following explanation of the procedure, risks, benefits and alternatives. Patient has altered mental status. The patient's POA understands, agrees and consents for the procedure. All questions were addressed. A time out was performed prior to the initiation of the procedure. Ultrasound confirmed a patent right common femoral artery. Ultrasound image was saved for documentation. Both groins were prepped and draped in sterile fashion. Maximal barrier sterile technique was utilized including caps, mask, sterile gowns, sterile gloves, sterile drape, hand hygiene and skin antiseptic. Right groin was anesthetized with 1% lidocaine. Small incision was made. Using ultrasound guidance, 21 gauge needle was directed into the right common femoral artery and micropuncture dilator set was placed. 5 [place] vascular sheath was placed over a Bentson wire. Angiogram was performed through the right groin sheath to evaluate the right external iliac artery and right common femoral artery. C2 catheter was used  to cannulate the left common iliac artery and advanced to the left external iliac artery. Angiography was performed in the left external iliac artery. Focus of active extravasation was identified from branch of the left inferior epigastric artery. STC microcatheter was advanced into the left inferior epigastric artery trunk and into the branch feeding the bleeding site. Selective angiography was obtained in this feeding arterial branch. The feeding branch was embolized using two 1 mm x 2 cm Ruby LP coils and  one 2 mm x 4 cm Ruby LP coil. Follow-up angiography was performed. Microcatheter and C2 catheter were removed. An Omni flush catheter was placed in the right external iliac artery and selective angiography was performed. Omni Flush catheter was removed. Injection was performed through the right groin sheath. The right groin sheath was removed using Angio-Seal closure device. Right groin hemostasis after deployment of the Angio-Seal device. Bandage placed over the puncture site. FINDINGS: Left external iliac artery is patent. The left common femoral artery, proximal left femoral arteries are patent. A small focus of active bleeding coming off a proximal branch of the left inferior epigastric artery corresponds with the area of contrast extravasation on the recent CT. Microcatheter was advanced into the vessel feeding the arterial bleed. Catheter could not be advanced distal to the bleeding site but was placed just proximal to the area of bleeding for coil embolization. Feeding branch was successfully embolized with 3 coils. No evidence for active extravasation at the end of the procedure. The left inferior epigastric artery remained patent after the embolization. Right common femoral artery and proximal right femoral arteries are patent. Right external iliac artery injection demonstrated no active extravasation from the right inferior epigastric artery. IMPRESSION: Successful coil embolization of a left inferior  epigastric artery branch that was actively bleeding. No evidence for active bleeding after the embolization. Electronically Signed   By: Richarda Overlie M.D.   On: 03/28/2020 13:09   US RENAL  Result Date: 03/27/2020 CLINICAL DATA:  Renal failure. EXAM: RENAL / URINARY TRACT ULTRASOUND COMPLETE COMPARISON:  CT of the abdomen and pelvis on 03/25/2020 FINDINGS: Right Kidney: Renal measurements: 11.1 x 4.5 x 4.4 centimeters = volume: 114.5 mL . Echogenicity within normal limits. No mass or hydronephrosis visualized. Left Kidney: Renal measurements: 10.6 x 5.3 x 4.8 centimeters = volume: 140.1 mL. Echogenicity within normal limits. No mass or hydronephrosis visualized. Bladder: Heterogeneous material identified within the urinary bladder, consistent with blood clot. A Foley catheter is not identified within the lumen of the urinary bladder. Parallel hyperechoic linear structures are along the posterior aspect of the bladder, raising the question of malposition of the Foley catheter. Other: Ascites. IMPRESSION: 1. No hydronephrosis or renal mass. 2. Large amount of heterogeneous material within the urinary bladder, consistent with blood clot. 3. Foley catheter is not identified within the urinary bladder. There are linear hyperechoic structures along the posterior aspect of the bladder, raising the question of malposition of the Foley catheter. 4. Ascites. Critical Value/emergent results were called by telephone at the time of interpretation on 03/27/2020 at 1:05 pm to provider Dr. Merry Lofty, who verbally acknowledged these results. Electronically Signed   By: Norva Pavlov M.D.   On: 03/27/2020 13:05   US Abdomen Limited  Result Date: 03/29/2020 CLINICAL DATA:  64 year old male with ascites, suspected cirrhosis on recent CT Abdomen and Pelvis. EXAM: LIMITED ABDOMEN ULTRASOUND FOR ASCITES TECHNIQUE: Limited ultrasound survey for ascites was performed in all four abdominal quadrants. COMPARISON:  CT Abdomen and  Pelvis 03/27/2020. FINDINGS: Small volume ascites with small volume of free fluid demonstrated in all 4 quadrants. Nodular liver surface on image 2. Highly abnormal region of the bladder again noted, with complex collection not demonstrating vascular elements on brief color Doppler (image 18). IMPRESSION: 1. Small volume of ascites, stable from the CT 03/27/2020. 2. Nodular liver contour compatible with Cirrhosis. 3. Complex collection within or adjacent to the urinary bladder again noted in the pelvis. Electronically Signed   By: Rexene Edison  Margo AyeHall M.D.   On: 03/29/2020 00:45   IR US Guide Vasc Access Right  Result Date: 03/28/2020 INDICATION: 64 year old with acute hepatitis, hypotension and active bleeding in the anterior pelvis/lower abdomen. Plan for pelvic angiography and embolization. EXAM: 1. Bilateral pelvic angiography: Bilateral external iliac arteries and selective angiography of the left inferior epigastric artery and left inferior epigastric artery branch 2. Coil embolization left inferior epigastric artery branch 3. Ultrasound guidance for vascular access MEDICATIONS: None ANESTHESIA/SEDATION: None Patient was monitored by radiology and ICU nurse throughout the procedure. CONTRAST:  82 mL Omnipaque 300 FLUOROSCOPY TIME:  Fluoroscopy Time: 10 minutes, 6 seconds, 1194 mGy COMPLICATIONS: None immediate. PROCEDURE: Informed consent was obtained from the patient's sister [facility]) following explanation of the procedure, risks, benefits and alternatives. Patient has altered mental status. The patient's POA understands, agrees and consents for the procedure. All questions were addressed. A time out was performed prior to the initiation of the procedure. Ultrasound confirmed a patent right common femoral artery. Ultrasound image was saved for documentation. Both groins were prepped and draped in sterile fashion. Maximal barrier sterile technique was utilized including caps, mask, sterile gowns, sterile gloves, sterile  drape, hand hygiene and skin antiseptic. Right groin was anesthetized with 1% lidocaine. Small incision was made. Using ultrasound guidance, 21 gauge needle was directed into the right common femoral artery and micropuncture dilator set was placed. 5 [place] vascular sheath was placed over a Bentson wire. Angiogram was performed through the right groin sheath to evaluate the right external iliac artery and right common femoral artery. C2 catheter was used to cannulate the left common iliac artery and advanced to the left external iliac artery. Angiography was performed in the left external iliac artery. Focus of active extravasation was identified from branch of the left inferior epigastric artery. STC microcatheter was advanced into the left inferior epigastric artery trunk and into the branch feeding the bleeding site. Selective angiography was obtained in this feeding arterial branch. The feeding branch was embolized using two 1 mm x 2 cm Ruby LP coils and one 2 mm x 4 cm Ruby LP coil. Follow-up angiography was performed. Microcatheter and C2 catheter were removed. An Omni flush catheter was placed in the right external iliac artery and selective angiography was performed. Omni Flush catheter was removed. Injection was performed through the right groin sheath. The right groin sheath was removed using Angio-Seal closure device. Right groin hemostasis after deployment of the Angio-Seal device. Bandage placed over the puncture site. FINDINGS: Left external iliac artery is patent. The left common femoral artery, proximal left femoral arteries are patent. A small focus of active bleeding coming off a proximal branch of the left inferior epigastric artery corresponds with the area of contrast extravasation on the recent CT. Microcatheter was advanced into the vessel feeding the arterial bleed. Catheter could not be advanced distal to the bleeding site but was placed just proximal to the area of bleeding for coil  embolization. Feeding branch was successfully embolized with 3 coils. No evidence for active extravasation at the end of the procedure. The left inferior epigastric artery remained patent after the embolization. Right common femoral artery and proximal right femoral arteries are patent. Right external iliac artery injection demonstrated no active extravasation from the right inferior epigastric artery. IMPRESSION: Successful coil embolization of a left inferior epigastric artery branch that was actively bleeding. No evidence for active bleeding after the embolization. Electronically Signed   By: Richarda OverlieAdam  Jaydrien Wassenaar M.D.   On: 03/28/2020 13:09  DG Chest Port 1 View  Result Date: 03/28/2020 CLINICAL DATA:  Abnormal respiration EXAM: PORTABLE CHEST 1 VIEW COMPARISON:  03/25/2020 FINDINGS: Layering moderate left pleural effusion. Small right pleural effusion. Associated left lower lobe opacity, likely atelectasis. These findings may be mildly progressive from the prior. No pneumothorax. Left subclavian venous catheter terminating at the cavoatrial junction. The heart is normal in size. IMPRESSION: Moderate left and small right pleural effusions, mildly progressive. Left lower lobe opacity, likely atelectasis. Electronically Signed   By: Charline Bills M.D.   On: 03/28/2020 04:16   IR EMBO ART  VEN HEMORR LYMPH EXTRAV  INC GUIDE ROADMAPPING  Result Date: 03/28/2020 INDICATION: 64 year old with acute hepatitis, hypotension and active bleeding in the anterior pelvis/lower abdomen. Plan for pelvic angiography and embolization. EXAM: 1. Bilateral pelvic angiography: Bilateral external iliac arteries and selective angiography of the left inferior epigastric artery and left inferior epigastric artery branch 2. Coil embolization left inferior epigastric artery branch 3. Ultrasound guidance for vascular access MEDICATIONS: None ANESTHESIA/SEDATION: None Patient was monitored by radiology and ICU nurse throughout the procedure.  CONTRAST:  82 mL Omnipaque 300 FLUOROSCOPY TIME:  Fluoroscopy Time: 10 minutes, 6 seconds, 1194 mGy COMPLICATIONS: None immediate. PROCEDURE: Informed consent was obtained from the patient's sister [facility]) following explanation of the procedure, risks, benefits and alternatives. Patient has altered mental status. The patient's POA understands, agrees and consents for the procedure. All questions were addressed. A time out was performed prior to the initiation of the procedure. Ultrasound confirmed a patent right common femoral artery. Ultrasound image was saved for documentation. Both groins were prepped and draped in sterile fashion. Maximal barrier sterile technique was utilized including caps, mask, sterile gowns, sterile gloves, sterile drape, hand hygiene and skin antiseptic. Right groin was anesthetized with 1% lidocaine. Small incision was made. Using ultrasound guidance, 21 gauge needle was directed into the right common femoral artery and micropuncture dilator set was placed. 5 [place] vascular sheath was placed over a Bentson wire. Angiogram was performed through the right groin sheath to evaluate the right external iliac artery and right common femoral artery. C2 catheter was used to cannulate the left common iliac artery and advanced to the left external iliac artery. Angiography was performed in the left external iliac artery. Focus of active extravasation was identified from branch of the left inferior epigastric artery. STC microcatheter was advanced into the left inferior epigastric artery trunk and into the branch feeding the bleeding site. Selective angiography was obtained in this feeding arterial branch. The feeding branch was embolized using two 1 mm x 2 cm Ruby LP coils and one 2 mm x 4 cm Ruby LP coil. Follow-up angiography was performed. Microcatheter and C2 catheter were removed. An Omni flush catheter was placed in the right external iliac artery and selective angiography was performed. Omni  Flush catheter was removed. Injection was performed through the right groin sheath. The right groin sheath was removed using Angio-Seal closure device. Right groin hemostasis after deployment of the Angio-Seal device. Bandage placed over the puncture site. FINDINGS: Left external iliac artery is patent. The left common femoral artery, proximal left femoral arteries are patent. A small focus of active bleeding coming off a proximal branch of the left inferior epigastric artery corresponds with the area of contrast extravasation on the recent CT. Microcatheter was advanced into the vessel feeding the arterial bleed. Catheter could not be advanced distal to the bleeding site but was placed just proximal to the area of bleeding for coil  embolization. Feeding branch was successfully embolized with 3 coils. No evidence for active extravasation at the end of the procedure. The left inferior epigastric artery remained patent after the embolization. Right common femoral artery and proximal right femoral arteries are patent. Right external iliac artery injection demonstrated no active extravasation from the right inferior epigastric artery. IMPRESSION: Successful coil embolization of a left inferior epigastric artery branch that was actively bleeding. No evidence for active bleeding after the embolization. Electronically Signed   By: Richarda Overlie M.D.   On: 03/28/2020 13:09    Anti-infectives: Anti-infectives (From admission, onward)   Start     Dose/Rate Route Frequency Ordered Stop   03/25/20 1200  ceFEPIme (MAXIPIME) 2 g in sodium chloride 0.9 % 100 mL IVPB        2 g 200 mL/hr over 30 Minutes Intravenous Every 12 hours 03/25/20 0439     03/25/20 0445  metroNIDAZOLE (FLAGYL) IVPB 500 mg        500 mg 100 mL/hr over 60 Minutes Intravenous Every 8 hours 03/25/20 0442     03/25/20 0015  ceFEPIme (MAXIPIME) 2 g in sodium chloride 0.9 % 100 mL IVPB        2 g 200 mL/hr over 30 Minutes Intravenous  Once 03/25/20 0008  03/25/20 0111   03/25/20 0015  metroNIDAZOLE (FLAGYL) IVPB 500 mg        500 mg 100 mL/hr over 60 Minutes Intravenous  Once 03/25/20 0008 03/25/20 0143   03/25/20 0015  vancomycin (VANCOCIN) IVPB 1000 mg/200 mL premix  Status:  Discontinued        1,000 mg 200 mL/hr over 60 Minutes Intravenous  Once 03/25/20 0008 03/25/20 0012   03/25/20 0015  vancomycin (VANCOREADY) IVPB 1500 mg/300 mL        1,500 mg 150 mL/hr over 120 Minutes Intravenous  Once 03/25/20 0012 03/25/20 0256      Assessment/Plan: 63 with acute liver failure, encephalopathy, septic shock and intra-abdominal hematoma.  Enlarging hematoma in lower abdomen and pelvis, likely extraperitoneal.  Bleeding branch of left inferior epigastric artery was embolized on 03/27/20 and no other areas of active bleeding identified on angiography.     At this time, the situation is dire with multiorgan failure and coagulopathy.  If additional treatment is desired, recommend CTA examination to look for additional areas of bleeding prior to catheter angiography.    Discussed with Anders Simmonds of Critical Care.   Arn Medal 03/29/2020

## 2020-03-29 NOTE — Progress Notes (Signed)
Per Dr. Cyndie Chime, no more blood products at this time. Continue supportive care with pressors and fentanyl for comfort per family wishes.

## 2020-03-29 NOTE — Plan of Care (Signed)
Saw patient at bedside.  Worsening shock with increasing pressor requirements.  Shock likely due to bleeding (from either uro or GI source), but septic shock also in the DDx.  Pending CT abdomen and pelvis to try to localize bleeding, but currently too sick to go down.    - currently patient is very confused and delirious but is protecting his airway - levophed at 30 through a central line - lactate increasing (7) but unable to clear effective with his liver dysfunction\ - foley without much output at all.  Bedside RN tried to flush, but unable to flush.  - on [place], there is a solid component either anterior to the bladder or in the bladder- ? Clot.  Some of this was present in CT 8/7.    Plan: - additional bolus of fluid - CT A/P.  Consider consulting surgery or ?urology depending on CT - VBG - continue antibiotics, broad spectrum - continue frequent CBCs.

## 2020-03-29 NOTE — Progress Notes (Signed)
[facility] Physician-Brief Progress Note Patient Name: Cory Aguirre DOB: Jul 27, 1956 MRN: [id number]   Date of Service  03/29/2020  HPI/Events of Note  Communication with Gen Surgeon Dr Darnell Level.   eICU Interventions  Not a candidate for surgery. He will stop by early in AM. Poor prognosis due to coagulopathy from liver failure.      Intervention Category Intermediate Interventions: Communication with other healthcare providers and/or family  Ranee Gosselin 03/29/2020, 5:02 AM

## 2020-03-29 NOTE — Progress Notes (Signed)
Called regarding patient with worsening status, dropping hemoglobin and rapidly increasing pressor requirement overnight. Repeat CT scan showed enlarging pelvic hematoma. On personal review of scan, catheter appears to be in the same location in the bladder, with large hematoma in the space of retzius causing significant external bladder compression. Flushed catheter multiple times with return of clear pink urine without clots. Very unlikely that hematoma visualized on scan is within the bladder, more consistent with persistent bleeding that was visualized into the space of retzius during angiography yesterday, was noted at the time to have bleeding from multiple sites into this space. Recommended overnight to Dr. Marlane Mingle that he contact general surgery and interventional radiology to discuss possible options, both felt that further intervention would be too risky given his significant coagulopathy. Patient is DNR/DNI, and family will be coming in this morning with possible transition to comfort care.

## 2020-03-29 NOTE — Progress Notes (Signed)
[facility]Link Physician-Brief Progress Note Patient Name: JOSHUAH MINELLA DOB: [birth year] MRN: [id number]   Date of Service  03/29/2020  HPI/Events of Note  OK to Dc IVC order. Going for comfort care soon.   eICU Interventions  As above     Intervention Category Minor Interventions: Other:  Ranee Gosselin 03/29/2020, 6:37 AM

## 2020-03-29 NOTE — Progress Notes (Signed)
[facility]Link Physician-Brief Progress Note Patient Name: Cory Aguirre DOB: 10/02/1955 MRN: [id number]   Date of Service  03/29/2020  HPI/Events of Note  On and off goes agonal apneic.  Camera: Back sats in 96%, sinus tachy. Encephalopathic.   eICU Interventions  discussed with bed side RN.  Continue care for now. Labs Hg > 7.5, stanle platelet. On nasal o2.  BiPAP might make him aspirate.  Family still not decided about intubation or not, for now no CPR.      Intervention Category Intermediate Interventions: Respiratory distress - evaluation and management  Ranee Gosselin 03/29/2020, 6:03 AM

## 2020-03-30 LAB — BPAM RBC
Blood Product Expiration Date: 202108192359
Blood Product Expiration Date: 202108232359
Blood Product Expiration Date: 202108232359
Blood Product Expiration Date: 202108312359
Unit Type and Rh: 6200
Unit Type and Rh: 6200
Unit Type and Rh: 6200
Unit Type and Rh: 6200

## 2020-03-30 LAB — TYPE AND SCREEN
ABO/RH(D): A POS
Antibody Screen: NEGATIVE
Unit division: 0
Unit division: 0
Unit division: 0
Unit division: 0

## 2020-03-30 LAB — CULTURE, BLOOD (ROUTINE X 2)
Culture: NO GROWTH
Culture: NO GROWTH
Special Requests: ADEQUATE
Special Requests: ADEQUATE

## 2020-03-30 LAB — MYOGLOBIN, URINE: Myoglobin, Ur: 13 ng/mL (ref 0–13)

## 2020-04-12 MED FILL — Sodium Chloride IV Soln 0.9%: INTRAVENOUS | Qty: 250 | Status: AC

## 2020-04-12 MED FILL — Norepinephrine Bitartrate IV Soln 1 MG/ML (Base Equivalent): INTRAVENOUS | Qty: 4 | Status: AC

## 2020-04-21 NOTE — Death Summary Note (Addendum)
DEATH SUMMARY   Patient Details  Name: Cory Aguirre MRN: [id number] DOB: 1956/07/16  Admission/Discharge Information   Admit Date:  16-Apr-2020  Date of Death: Date of Death: April 22, 2020  Time of Death: Time of Death: 0840  Length of Stay: 5  Referring Physician: Kristian Covey, MD   Reason(s) for Hospitalization  Patient was admitted to the hospital following being found hypotensive, initial evaluation revealed patient in septic shock  Diagnoses  Preliminary cause of death:  Shock Secondary Diagnoses (including complications and co-morbidities):  Active Problems:   Hyponatremia   Elevated LFTs   Hyperammonemia (HCC)   Sepsis (HCC)   Septic shock (HCC)   Metabolic acidosis   Acute metabolic encephalopathy   Coagulopathy (HCC)   Liver failure without hepatic coma (HCC)   Alcoholic hepatic failure with coma (HCC) Acute blood loss anemia Acute kidney injury Brief Hospital Course (including significant findings, care, treatment, and services provided and events leading to death)  Cory Aguirre is a 64 y.o. year old male who was admitted to the hospital with sepsis, septic shock.  Was found hypotensive at home and brought to the hospital.  He had had a prodrome of about a week of not feeling well, loose dark stools, nausea with poor p.o. intake.  Had felt chronically unwell with about 70 pound weight loss in the last 6 months. Patient was fluid resuscitated, started on pressors Received paracentesis on 03/25/2020, 03/26/2020 Patient did develop blood loss anemia and was transfused, had a angiogram revealing bleeding from right inferior epigastric artery for which he had coiling and embolization. Patient continued to deteriorate leading to multiple discussions with family members Following further discussions with family regards to his prognosis and severe illness with significant comorbid conditions, patient was transition to comfort measures only.  Patient succumbed to his illness at 0  840 on 04/22/20 Pertinent Labs and Studies  Significant Diagnostic Studies CT ABDOMEN PELVIS WO CONTRAST  Result Date: 03/29/2020 CLINICAL DATA:  Anemia and shock EXAM: CT ABDOMEN AND PELVIS WITHOUT CONTRAST TECHNIQUE: Multidetector CT imaging of the abdomen and pelvis was performed following the standard protocol without IV contrast. COMPARISON:  None. FINDINGS: LOWER CHEST: Large pleural effusions with basilar atelectasis. HEPATOBILIARY: Large amount of ascites. There is vicarious excretion of contrast in the gallbladder. PANCREAS: Normal pancreas. No ductal dilatation or peripancreatic fluid collection. SPLEEN: Spleen is enlarged, measuring 15.0 cm in AP dimension. ADRENALS/URINARY TRACT: The adrenal glands are normal. No hydronephrosis, nephroureterolithiasis or solid renal mass. There is a large hematoma within the anterior pelvis likely within the urinary bladder. There is a Foley catheter at the dependent aspect of this collection. Collection is increased in size. Delayed excretion of contrast from both kidneys. STOMACH/BOWEL: There is no hiatal hernia. Normal duodenal course and caliber. No small bowel dilatation or inflammation. No focal colonic abnormality. VASCULAR/LYMPHATIC: There is calcific atherosclerosis of the abdominal aorta. No lymphadenopathy. REPRODUCTIVE: There are calcifications within the normal-sized prostate. Symmetric seminal vesicles. MUSCULOSKELETAL. No bony spinal canal stenosis or focal osseous abnormality. OTHER: Anasarca. Intra-abdominal blood tracks into both inguinal canals. IMPRESSION: 1. Large hematoma within the anterior pelvis, likely within the urinary bladder. Collection has increased in size. 2. Large amount of ascites, large pleural effusions and anasarca. 3. Aortic Atherosclerosis (ICD10-I70.0). Electronically Signed   By: Deatra Robinson M.D.   On: 03/29/2020 03:22   DG Chest 1 View  Result Date: 03/25/2020 CLINICAL DATA:  Central line placement EXAM: CHEST  1 VIEW  COMPARISON:  Same day chest  radiograph FINDINGS: Interval placement of left subclavian approach central venous catheter with distal tip terminating at the level of the superior cavoatrial junction. Stable heart size. Atherosclerotic calcification of the aortic knob. Low lung volumes with hazy bibasilar opacities. Trace bilateral pleural effusions not excluded. No pneumothorax. IMPRESSION: 1. Interval placement of left subclavian approach central venous catheter with distal tip terminating at the level of the superior cavoatrial junction. No pneumothorax. 2. New hazy bibasilar opacities, which may reflect atelectasis accentuated by low lung volumes. Mild edema and/or small bilateral pleural effusions could also have a similar appearance. Electronically Signed   By: Duanne Guess D.O.   On: 03/25/2020 13:14   CT Head Wo Contrast  Result Date: 03/25/2020 CLINICAL DATA:  Mental status change EXAM: CT HEAD WITHOUT CONTRAST TECHNIQUE: Contiguous axial images were obtained from the base of the skull through the vertex without intravenous contrast. COMPARISON:  None. FINDINGS: Brain: No acute territorial infarction, hemorrhage, or intracranial mass. Mild atrophy. Mild hypodensity in the white matter consistent with chronic small vessel ischemic change. Nonenlarged ventricles. Vascular: No hyperdense vessels.  Carotid vascular calcification Skull: Normal. Negative for fracture or focal lesion. Sinuses/Orbits: No acute finding. Other: None IMPRESSION: 1. No CT evidence for acute intracranial abnormality. 2. Atrophy and mild chronic small vessel ischemic changes of the white matter. Electronically Signed   By: Jasmine Pang M.D.   On: 03/25/2020 02:56   NM Hepatobiliary Liver Func  Result Date: 03/26/2020 CLINICAL DATA:  Chronic upper abdominal pain. Jaundice with elevated liver function studies. EXAM: NUCLEAR MEDICINE HEPATOBILIARY IMAGING TECHNIQUE: Sequential images of the abdomen were obtained out to 60 minutes  following intravenous administration of radiopharmaceutical. RADIOPHARMACEUTICALS:  5.3 mCi Tc-38m  Choletec IV COMPARISON:  Ultrasound and CT 03/25/2020. FINDINGS: Mildly delayed hepatic uptake with limited biliary excretion. Gallbladder activity is visualized, consistent with patency of cystic duct. No passage of biliary activity into the small bowel is identified, likely related to hepatic dysfunction. IMPRESSION: 1. The cystic duct is patent with filling of the gallbladder lumen. 2. Delayed hepatic uptake and limited biliary excretion with no passage of activity into the small bowel, likely related to hepatic dysfunction. No biliary dilatation on recent imaging. Electronically Signed   By: Carey Bullocks M.D.   On: 03/26/2020 12:42   CT ABDOMEN PELVIS W CONTRAST  Result Date: 03/27/2020 CLINICAL DATA:  Concern for bleeding into bladder by prior ultrasound EXAM: CT ABDOMEN AND PELVIS WITH CONTRAST TECHNIQUE: Multidetector CT imaging of the abdomen and pelvis was performed using the standard protocol following bolus administration of intravenous contrast. CONTRAST:  20mL OMNIPAQUE IOHEXOL 300 MG/ML  SOLN COMPARISON:  03/25/2020 CT.  Renal ultrasound 03/27/2020 FINDINGS: Lower chest: Moderate to large bilateral pleural effusions, increasing since prior study. Compressive atelectasis in the lower lobes. Hepatobiliary: Suggestion of nodularity to the surface of the liver again noted. No focal hepatic abnormality. High-density material seen within the gallbladder could reflect vicarious excretion of contrast, similar prior study. Pancreas: No focal abnormality or ductal dilatation. Spleen: Scattered low-density lesions within the spleen. The largest noted medially measuring 3.2 cm. Spleen is enlarged measuring 13.9 cm. Adrenals/Urinary Tract: No renal or adrenal mass. No hydronephrosis. Delayed imaging through the kidneys demonstrates no significant excretion of contrast into the collecting systems bilaterally. In  what appears to be the bladder within the pelvis, there are layering hematocrit levels and fluid levels. This is felt to most likely be blood within the urinary bladder. On the sagittal view, it appears that the Foley catheter  is posterior to what is felt represent the bladder. Conceivably, this could be a complex fluid collection/hematoma anterior to a decompressed urinary bladder, but this is difficult to confirm. Stomach/Bowel: Stomach, large and small bowel grossly unremarkable. Vascular/Lymphatic: Aortic atherosclerosis. No enlarged abdominal or pelvic lymph nodes. Reproductive: No visible focal abnormality. Other: Moderate ascites throughout the abdomen and pelvis. Musculoskeletal: No acute bony abnormality. IMPRESSION: Rounded area with hematocrit/fluid levels in the pelvis felt to most likely be the urinary bladder. It appears that the indwelling Foley catheter is posterior to this fluid collection. This conceivably could reflect a complex fluid collection/hematoma anterior to a decompressed urinary bladder. 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 Areas of low-density within the spleen, nonspecific and stable since prior study. Delayed excretion of contrast from the kidneys into the collecting systems bilaterally. No hydronephrosis. Electronically Signed   By: Charlett Nose M.D.   On: 03/27/2020 17:51   CT ABDOMEN PELVIS W CONTRAST  Result Date: 03/25/2020 CLINICAL DATA:  Jaundice EXAM: CT ABDOMEN AND PELVIS WITH CONTRAST TECHNIQUE: Multidetector CT imaging of the abdomen and pelvis was performed using the standard protocol following bolus administration of intravenous contrast. CONTRAST:  80mL OMNIPAQUE IOHEXOL 300 MG/ML  SOLN COMPARISON:  None. FINDINGS: Lower chest: Lung bases demonstrate small bilateral pleural effusions. Cardiac size within normal limits. Hepatobiliary: No focal hepatic abnormality. Hyperdense sludge or vicarious contrast excretion within the gallbladder. Possible  gallbladder wall thickening or edema. Possible contour nodularity of the liver. Pancreas: Unremarkable. No pancreatic ductal dilatation or surrounding inflammatory changes. Spleen: Enlarged up to 15 cm. Adrenals/Urinary Tract: Adrenal glands are normal. Kidneys show no hydronephrosis. The urinary bladder is unremarkable. Stomach/Bowel: Stomach nonenlarged. Thickened slightly dilated appearing jejunal small bowel loops in the left upper quadrant. Generalized fluid-filled small bowel without well-defined transition. Mild wall thickening of the hepatic flexure and ascending colon. Negative appendix. Vascular/Lymphatic: Moderate aortic atherosclerosis. No aneurysm. No suspicious nodes Reproductive: Prostate is unremarkable.  Prostate calcifications. Other: No free air. Moderate volume of ascites within the abdomen and pelvis Musculoskeletal: No acute or suspicious osseous abnormality. IMPRESSION: 1. Small right greater than left pleural effusions. 2. Suspected liver disease. There is splenomegaly and moderate volume of ascites within the abdomen and pelvis. 3. Diffuse fluid within the small bowel. Mild thickened appearance of left upper quadrant jejunal small bowel loops with mild wall thickening of the ascending colon and hepatic flexure. Findings could be secondary to enteritis/colitis versus portal enteropathy. 4. Hyperdense sludge within the gallbladder. Possible gallbladder wall thickening or edema, correlation with right upper quadrant ultrasound could be obtained as indicated Aortic Atherosclerosis (ICD10-I70.0). Electronically Signed   By: Jasmine Pang M.D.   On: 03/25/2020 03:35   IR Angiogram Pelvis Selective Or Supraselective  Result Date: 03/28/2020 INDICATION: 64 year old with acute hepatitis, hypotension and active bleeding in the anterior pelvis/lower abdomen. Plan for pelvic angiography and embolization. EXAM: 1. Bilateral pelvic angiography: Bilateral external iliac arteries and selective  angiography of the left inferior epigastric artery and left inferior epigastric artery branch 2. Coil embolization left inferior epigastric artery branch 3. Ultrasound guidance for vascular access MEDICATIONS: None ANESTHESIA/SEDATION: None Patient was monitored by radiology and ICU nurse throughout the procedure. CONTRAST:  82 mL Omnipaque 300 FLUOROSCOPY TIME:  Fluoroscopy Time: 10 minutes, 6 seconds, 1194 mGy COMPLICATIONS: None immediate. PROCEDURE: Informed consent was obtained from the patient's sister [facility]) following explanation of the procedure, risks, benefits and alternatives. Patient has altered mental status. The patient's POA understands, agrees and consents for the procedure. All  questions were addressed. A time out was performed prior to the initiation of the procedure. Ultrasound confirmed a patent right common femoral artery. Ultrasound image was saved for documentation. Both groins were prepped and draped in sterile fashion. Maximal barrier sterile technique was utilized including caps, mask, sterile gowns, sterile gloves, sterile drape, hand hygiene and skin antiseptic. Right groin was anesthetized with 1% lidocaine. Small incision was made. Using ultrasound guidance, 21 gauge needle was directed into the right common femoral artery and micropuncture dilator set was placed. 5 [place] vascular sheath was placed over a Bentson wire. Angiogram was performed through the right groin sheath to evaluate the right external iliac artery and right common femoral artery. C2 catheter was used to cannulate the left common iliac artery and advanced to the left external iliac artery. Angiography was performed in the left external iliac artery. Focus of active extravasation was identified from branch of the left inferior epigastric artery. STC microcatheter was advanced into the left inferior epigastric artery trunk and into the branch feeding the bleeding site. Selective angiography was obtained in this feeding  arterial branch. The feeding branch was embolized using two 1 mm x 2 cm Ruby LP coils and one 2 mm x 4 cm Ruby LP coil. Follow-up angiography was performed. Microcatheter and C2 catheter were removed. An Omni flush catheter was placed in the right external iliac artery and selective angiography was performed. Omni Flush catheter was removed. Injection was performed through the right groin sheath. The right groin sheath was removed using Angio-Seal closure device. Right groin hemostasis after deployment of the Angio-Seal device. Bandage placed over the puncture site. FINDINGS: Left external iliac artery is patent. The left common femoral artery, proximal left femoral arteries are patent. A small focus of active bleeding coming off a proximal branch of the left inferior epigastric artery corresponds with the area of contrast extravasation on the recent CT. Microcatheter was advanced into the vessel feeding the arterial bleed. Catheter could not be advanced distal to the bleeding site but was placed just proximal to the area of bleeding for coil embolization. Feeding branch was successfully embolized with 3 coils. No evidence for active extravasation at the end of the procedure. The left inferior epigastric artery remained patent after the embolization. Right common femoral artery and proximal right femoral arteries are patent. Right external iliac artery injection demonstrated no active extravasation from the right inferior epigastric artery. IMPRESSION: Successful coil embolization of a left inferior epigastric artery branch that was actively bleeding. No evidence for active bleeding after the embolization. Electronically Signed   By: Richarda Overlie M.D.   On: 03/28/2020 13:09   IR Angiogram Selective Each Additional Vessel  Result Date: 03/28/2020 INDICATION: 65 year old with acute hepatitis, hypotension and active bleeding in the anterior pelvis/lower abdomen. Plan for pelvic angiography and embolization. EXAM: 1.  Bilateral pelvic angiography: Bilateral external iliac arteries and selective angiography of the left inferior epigastric artery and left inferior epigastric artery branch 2. Coil embolization left inferior epigastric artery branch 3. Ultrasound guidance for vascular access MEDICATIONS: None ANESTHESIA/SEDATION: None Patient was monitored by radiology and ICU nurse throughout the procedure. CONTRAST:  82 mL Omnipaque 300 FLUOROSCOPY TIME:  Fluoroscopy Time: 10 minutes, 6 seconds, 1194 mGy COMPLICATIONS: None immediate. PROCEDURE: Informed consent was obtained from the patient's sister [facility]) following explanation of the procedure, risks, benefits and alternatives. Patient has altered mental status. The patient's POA understands, agrees and consents for the procedure. All questions were addressed. A time out was performed  prior to the initiation of the procedure. Ultrasound confirmed a patent right common femoral artery. Ultrasound image was saved for documentation. Both groins were prepped and draped in sterile fashion. Maximal barrier sterile technique was utilized including caps, mask, sterile gowns, sterile gloves, sterile drape, hand hygiene and skin antiseptic. Right groin was anesthetized with 1% lidocaine. Small incision was made. Using ultrasound guidance, 21 gauge needle was directed into the right common femoral artery and micropuncture dilator set was placed. 5 [place] vascular sheath was placed over a Bentson wire. Angiogram was performed through the right groin sheath to evaluate the right external iliac artery and right common femoral artery. C2 catheter was used to cannulate the left common iliac artery and advanced to the left external iliac artery. Angiography was performed in the left external iliac artery. Focus of active extravasation was identified from branch of the left inferior epigastric artery. STC microcatheter was advanced into the left inferior epigastric artery trunk and into the branch  feeding the bleeding site. Selective angiography was obtained in this feeding arterial branch. The feeding branch was embolized using two 1 mm x 2 cm Ruby LP coils and one 2 mm x 4 cm Ruby LP coil. Follow-up angiography was performed. Microcatheter and C2 catheter were removed. An Omni flush catheter was placed in the right external iliac artery and selective angiography was performed. Omni Flush catheter was removed. Injection was performed through the right groin sheath. The right groin sheath was removed using Angio-Seal closure device. Right groin hemostasis after deployment of the Angio-Seal device. Bandage placed over the puncture site. FINDINGS: Left external iliac artery is patent. The left common femoral artery, proximal left femoral arteries are patent. A small focus of active bleeding coming off a proximal branch of the left inferior epigastric artery corresponds with the area of contrast extravasation on the recent CT. Microcatheter was advanced into the vessel feeding the arterial bleed. Catheter could not be advanced distal to the bleeding site but was placed just proximal to the area of bleeding for coil embolization. Feeding branch was successfully embolized with 3 coils. No evidence for active extravasation at the end of the procedure. The left inferior epigastric artery remained patent after the embolization. Right common femoral artery and proximal right femoral arteries are patent. Right external iliac artery injection demonstrated no active extravasation from the right inferior epigastric artery. IMPRESSION: Successful coil embolization of a left inferior epigastric artery branch that was actively bleeding. No evidence for active bleeding after the embolization. Electronically Signed   By: Richarda Overlie M.D.   On: 03/28/2020 13:09   IR Angiogram Selective Each Additional Vessel  Result Date: 03/28/2020 INDICATION: 64 year old with acute hepatitis, hypotension and active bleeding in the anterior  pelvis/lower abdomen. Plan for pelvic angiography and embolization. EXAM: 1. Bilateral pelvic angiography: Bilateral external iliac arteries and selective angiography of the left inferior epigastric artery and left inferior epigastric artery branch 2. Coil embolization left inferior epigastric artery branch 3. Ultrasound guidance for vascular access MEDICATIONS: None ANESTHESIA/SEDATION: None Patient was monitored by radiology and ICU nurse throughout the procedure. CONTRAST:  82 mL Omnipaque 300 FLUOROSCOPY TIME:  Fluoroscopy Time: 10 minutes, 6 seconds, 1194 mGy COMPLICATIONS: None immediate. PROCEDURE: Informed consent was obtained from the patient's sister [facility]) following explanation of the procedure, risks, benefits and alternatives. Patient has altered mental status. The patient's POA understands, agrees and consents for the procedure. All questions were addressed. A time out was performed prior to the initiation of the procedure. Ultrasound  confirmed a patent right common femoral artery. Ultrasound image was saved for documentation. Both groins were prepped and draped in sterile fashion. Maximal barrier sterile technique was utilized including caps, mask, sterile gowns, sterile gloves, sterile drape, hand hygiene and skin antiseptic. Right groin was anesthetized with 1% lidocaine. Small incision was made. Using ultrasound guidance, 21 gauge needle was directed into the right common femoral artery and micropuncture dilator set was placed. 5 [place] vascular sheath was placed over a Bentson wire. Angiogram was performed through the right groin sheath to evaluate the right external iliac artery and right common femoral artery. C2 catheter was used to cannulate the left common iliac artery and advanced to the left external iliac artery. Angiography was performed in the left external iliac artery. Focus of active extravasation was identified from branch of the left inferior epigastric artery. STC microcatheter was  advanced into the left inferior epigastric artery trunk and into the branch feeding the bleeding site. Selective angiography was obtained in this feeding arterial branch. The feeding branch was embolized using two 1 mm x 2 cm Ruby LP coils and one 2 mm x 4 cm Ruby LP coil. Follow-up angiography was performed. Microcatheter and C2 catheter were removed. An Omni flush catheter was placed in the right external iliac artery and selective angiography was performed. Omni Flush catheter was removed. Injection was performed through the right groin sheath. The right groin sheath was removed using Angio-Seal closure device. Right groin hemostasis after deployment of the Angio-Seal device. Bandage placed over the puncture site. FINDINGS: Left external iliac artery is patent. The left common femoral artery, proximal left femoral arteries are patent. A small focus of active bleeding coming off a proximal branch of the left inferior epigastric artery corresponds with the area of contrast extravasation on the recent CT. Microcatheter was advanced into the vessel feeding the arterial bleed. Catheter could not be advanced distal to the bleeding site but was placed just proximal to the area of bleeding for coil embolization. Feeding branch was successfully embolized with 3 coils. No evidence for active extravasation at the end of the procedure. The left inferior epigastric artery remained patent after the embolization. Right common femoral artery and proximal right femoral arteries are patent. Right external iliac artery injection demonstrated no active extravasation from the right inferior epigastric artery. IMPRESSION: Successful coil embolization of a left inferior epigastric artery branch that was actively bleeding. No evidence for active bleeding after the embolization. Electronically Signed   By: Richarda Overlie M.D.   On: 03/28/2020 13:09   US RENAL  Result Date: 03/27/2020 CLINICAL DATA:  Renal failure. EXAM: RENAL / URINARY  TRACT ULTRASOUND COMPLETE COMPARISON:  CT of the abdomen and pelvis on 03/25/2020 FINDINGS: Right Kidney: Renal measurements: 11.1 x 4.5 x 4.4 centimeters = volume: 114.5 mL . Echogenicity within normal limits. No mass or hydronephrosis visualized. Left Kidney: Renal measurements: 10.6 x 5.3 x 4.8 centimeters = volume: 140.1 mL. Echogenicity within normal limits. No mass or hydronephrosis visualized. Bladder: Heterogeneous material identified within the urinary bladder, consistent with blood clot. A Foley catheter is not identified within the lumen of the urinary bladder. Parallel hyperechoic linear structures are along the posterior aspect of the bladder, raising the question of malposition of the Foley catheter. Other: Ascites. IMPRESSION: 1. No hydronephrosis or renal mass. 2. Large amount of heterogeneous material within the urinary bladder, consistent with blood clot. 3. Foley catheter is not identified within the urinary bladder. There are linear hyperechoic structures along the  posterior aspect of the bladder, raising the question of malposition of the Foley catheter. 4. Ascites. Critical Value/emergent results were called by telephone at the time of interpretation on 03/27/2020 at 1:05 pm to provider Dr. Merry LoftyLaura Paige Clark, who verbally acknowledged these results. Electronically Signed   By: Norva PavlovElizabeth  Brown M.D.   On: 03/27/2020 13:05   US Abdomen Limited  Result Date: 03/29/2020 CLINICAL DATA:  64 year old male with ascites, suspected cirrhosis on recent CT Abdomen and Pelvis. EXAM: LIMITED ABDOMEN ULTRASOUND FOR ASCITES TECHNIQUE: Limited ultrasound survey for ascites was performed in all four abdominal quadrants. COMPARISON:  CT Abdomen and Pelvis 03/27/2020. FINDINGS: Small volume ascites with small volume of free fluid demonstrated in all 4 quadrants. Nodular liver surface on image 2. Highly abnormal region of the bladder again noted, with complex collection not demonstrating vascular elements on brief  color Doppler (image 18). IMPRESSION: 1. Small volume of ascites, stable from the CT 03/27/2020. 2. Nodular liver contour compatible with Cirrhosis. 3. Complex collection within or adjacent to the urinary bladder again noted in the pelvis. Electronically Signed   By: Odessa FlemingH  Hall M.D.   On: 03/29/2020 00:45   IR US Guide Vasc Access Right  Result Date: 03/28/2020 INDICATION: 64 year old with acute hepatitis, hypotension and active bleeding in the anterior pelvis/lower abdomen. Plan for pelvic angiography and embolization. EXAM: 1. Bilateral pelvic angiography: Bilateral external iliac arteries and selective angiography of the left inferior epigastric artery and left inferior epigastric artery branch 2. Coil embolization left inferior epigastric artery branch 3. Ultrasound guidance for vascular access MEDICATIONS: None ANESTHESIA/SEDATION: None Patient was monitored by radiology and ICU nurse throughout the procedure. CONTRAST:  82 mL Omnipaque 300 FLUOROSCOPY TIME:  Fluoroscopy Time: 10 minutes, 6 seconds, 1194 mGy COMPLICATIONS: None immediate. PROCEDURE: Informed consent was obtained from the patient's sister [facility](POA) following explanation of the procedure, risks, benefits and alternatives. Patient has altered mental status. The patient's POA understands, agrees and consents for the procedure. All questions were addressed. A time out was performed prior to the initiation of the procedure. Ultrasound confirmed a patent right common femoral artery. Ultrasound image was saved for documentation. Both groins were prepped and draped in sterile fashion. Maximal barrier sterile technique was utilized including caps, mask, sterile gowns, sterile gloves, sterile drape, hand hygiene and skin antiseptic. Right groin was anesthetized with 1% lidocaine. Small incision was made. Using ultrasound guidance, 21 gauge needle was directed into the right common femoral artery and micropuncture dilator set was placed. 5 [place] vascular  sheath was placed over a Bentson wire. Angiogram was performed through the right groin sheath to evaluate the right external iliac artery and right common femoral artery. C2 catheter was used to cannulate the left common iliac artery and advanced to the left external iliac artery. Angiography was performed in the left external iliac artery. Focus of active extravasation was identified from branch of the left inferior epigastric artery. STC microcatheter was advanced into the left inferior epigastric artery trunk and into the branch feeding the bleeding site. Selective angiography was obtained in this feeding arterial branch. The feeding branch was embolized using two 1 mm x 2 cm Ruby LP coils and one 2 mm x 4 cm Ruby LP coil. Follow-up angiography was performed. Microcatheter and C2 catheter were removed. An Omni flush catheter was placed in the right external iliac artery and selective angiography was performed. Omni Flush catheter was removed. Injection was performed through the right groin sheath. The right groin sheath was removed using  Angio-Seal closure device. Right groin hemostasis after deployment of the Angio-Seal device. Bandage placed over the puncture site. FINDINGS: Left external iliac artery is patent. The left common femoral artery, proximal left femoral arteries are patent. A small focus of active bleeding coming off a proximal branch of the left inferior epigastric artery corresponds with the area of contrast extravasation on the recent CT. Microcatheter was advanced into the vessel feeding the arterial bleed. Catheter could not be advanced distal to the bleeding site but was placed just proximal to the area of bleeding for coil embolization. Feeding branch was successfully embolized with 3 coils. No evidence for active extravasation at the end of the procedure. The left inferior epigastric artery remained patent after the embolization. Right common femoral artery and proximal right femoral  arteries are patent. Right external iliac artery injection demonstrated no active extravasation from the right inferior epigastric artery. IMPRESSION: Successful coil embolization of a left inferior epigastric artery branch that was actively bleeding. No evidence for active bleeding after the embolization. Electronically Signed   By: Richarda Overlie M.D.   On: 03/28/2020 13:09   DG Chest Port 1 View  Result Date: 03/28/2020 CLINICAL DATA:  Abnormal respiration EXAM: PORTABLE CHEST 1 VIEW COMPARISON:  03/25/2020 FINDINGS: Layering moderate left pleural effusion. Small right pleural effusion. Associated left lower lobe opacity, likely atelectasis. These findings may be mildly progressive from the prior. No pneumothorax. Left subclavian venous catheter terminating at the cavoatrial junction. The heart is normal in size. IMPRESSION: Moderate left and small right pleural effusions, mildly progressive. Left lower lobe opacity, likely atelectasis. Electronically Signed   By: Charline Bills M.D.   On: 03/28/2020 04:16   DG Chest Portable 1 View  Result Date: 03/25/2020 CLINICAL DATA:  Sepsis EXAM: PORTABLE CHEST 1 VIEW COMPARISON:  11/16/2015 FINDINGS: The heart size and mediastinal contours are within normal limits. Both lungs are clear. The visualized skeletal structures are unremarkable. Calcification of the aorta. IMPRESSION: No active disease. Electronically Signed   By: Burman Nieves M.D.   On: 03/25/2020 01:07   IR EMBO ART  VEN HEMORR LYMPH EXTRAV  INC GUIDE ROADMAPPING  Result Date: 03/28/2020 INDICATION: 64 year old with acute hepatitis, hypotension and active bleeding in the anterior pelvis/lower abdomen. Plan for pelvic angiography and embolization. EXAM: 1. Bilateral pelvic angiography: Bilateral external iliac arteries and selective angiography of the left inferior epigastric artery and left inferior epigastric artery branch 2. Coil embolization left inferior epigastric artery branch 3. Ultrasound  guidance for vascular access MEDICATIONS: None ANESTHESIA/SEDATION: None Patient was monitored by radiology and ICU nurse throughout the procedure. CONTRAST:  82 mL Omnipaque 300 FLUOROSCOPY TIME:  Fluoroscopy Time: 10 minutes, 6 seconds, 1194 mGy COMPLICATIONS: None immediate. PROCEDURE: Informed consent was obtained from the patient's sister [facility]) following explanation of the procedure, risks, benefits and alternatives. Patient has altered mental status. The patient's POA understands, agrees and consents for the procedure. All questions were addressed. A time out was performed prior to the initiation of the procedure. Ultrasound confirmed a patent right common femoral artery. Ultrasound image was saved for documentation. Both groins were prepped and draped in sterile fashion. Maximal barrier sterile technique was utilized including caps, mask, sterile gowns, sterile gloves, sterile drape, hand hygiene and skin antiseptic. Right groin was anesthetized with 1% lidocaine. Small incision was made. Using ultrasound guidance, 21 gauge needle was directed into the right common femoral artery and micropuncture dilator set was placed. 5 [place] vascular sheath was placed over a Bentson wire. Angiogram  was performed through the right groin sheath to evaluate the right external iliac artery and right common femoral artery. C2 catheter was used to cannulate the left common iliac artery and advanced to the left external iliac artery. Angiography was performed in the left external iliac artery. Focus of active extravasation was identified from branch of the left inferior epigastric artery. STC microcatheter was advanced into the left inferior epigastric artery trunk and into the branch feeding the bleeding site. Selective angiography was obtained in this feeding arterial branch. The feeding branch was embolized using two 1 mm x 2 cm Ruby LP coils and one 2 mm x 4 cm Ruby LP coil. Follow-up angiography was performed.  Microcatheter and C2 catheter were removed. An Omni flush catheter was placed in the right external iliac artery and selective angiography was performed. Omni Flush catheter was removed. Injection was performed through the right groin sheath. The right groin sheath was removed using Angio-Seal closure device. Right groin hemostasis after deployment of the Angio-Seal device. Bandage placed over the puncture site. FINDINGS: Left external iliac artery is patent. The left common femoral artery, proximal left femoral arteries are patent. A small focus of active bleeding coming off a proximal branch of the left inferior epigastric artery corresponds with the area of contrast extravasation on the recent CT. Microcatheter was advanced into the vessel feeding the arterial bleed. Catheter could not be advanced distal to the bleeding site but was placed just proximal to the area of bleeding for coil embolization. Feeding branch was successfully embolized with 3 coils. No evidence for active extravasation at the end of the procedure. The left inferior epigastric artery remained patent after the embolization. Right common femoral artery and proximal right femoral arteries are patent. Right external iliac artery injection demonstrated no active extravasation from the right inferior epigastric artery. IMPRESSION: Successful coil embolization of a left inferior epigastric artery branch that was actively bleeding. No evidence for active bleeding after the embolization. Electronically Signed   By: Richarda Overlie M.D.   On: 03/28/2020 13:09   US Abdomen Limited RUQ  Result Date: 03/25/2020 CLINICAL DATA:  Elevated liver function studies.  Severe jaundice. EXAM: ULTRASOUND ABDOMEN LIMITED RIGHT UPPER QUADRANT COMPARISON:  CT 03/25/2020 FINDINGS: Gallbladder: Gallbladder is filled with sludge with thickened wall and pericholecystic edema. Murphy's sign is positive. No discrete stones are identified. Common bile duct: Diameter: 5 mm,  normal Liver: Heterogeneous liver parenchymal echotexture suggesting cirrhosis or fatty infiltration. No focal lesions identified. Portal vein is patent on color Doppler imaging with normal direction of blood flow towards the liver. Other: Upper abdominal ascites is present. IMPRESSION: 1. Gallbladder is filled with sludge, thickened wall, and pericholecystic edema. Positive Murphy's sign. Changes may represent acute cholecystitis in the appropriate clinical setting. 2. Heterogeneous liver parenchymal echotexture suggesting cirrhosis or fatty infiltration. 3. Upper abdominal ascites. Electronically Signed   By: Burman Nieves M.D.   On: 03/25/2020 05:49    Microbiology Recent Results (from the past 240 hour(s))  Culture, blood (routine x 2)     Status: None   Collection Time: 03/27/2020 11:00 PM   Specimen: BLOOD  Result Value Ref Range Status   Specimen Description   Final    BLOOD RIGHT ANTECUBITAL Performed at [facility], [address] [place], [place] [address]    Special Requests   Final    BOTTLES DRAWN AEROBIC AND ANAEROBIC Blood Culture adequate volume Performed at [facility], [address] [place], [place]  [address]    Culture   Final    NO GROWTH 5 DAYS Performed at [facility], [address], [place], [place] [address]    Report Status 04/14/2020 FINAL  Final  Culture, blood (routine x 2)     Status: None   Collection Time: 03/25/20 12:00 AM   Specimen: BLOOD  Result Value Ref Range Status   Specimen Description   Final    BLOOD RIGHT ANTECUBITAL Performed at [facility], [address] [place], [place] [address]    Special Requests   Final    BOTTLES DRAWN AEROBIC AND ANAEROBIC Blood Culture adequate volume Performed at [facility], [address], [place], [place] [address]    Culture   Final    NO GROWTH 5 DAYS Performed at [facility], [address]  [place], [place] [address]    Report Status 04/03/2020 FINAL  Final  Urine culture     Status: None   Collection Time: 03/25/20  5:06 AM   Specimen: In/Out Cath Urine  Result Value Ref Range Status   Specimen Description   Final    IN/OUT CATH URINE Performed at [facility], [address] [place], [place] [address]    Special Requests   Final    NONE Performed at [facility], [address] [place], [place] [address]    Culture   Final    NO GROWTH Performed at [facility], [address], [place], [place] [address]    Report Status 03/26/2020 FINAL  Final  SARS Coronavirus 2 by RT PCR (hospital order, performed in [facility] hospital lab) Nasopharyngeal Urine, Catheterized     Status: None   Collection Time: 03/25/20  6:16 AM   Specimen: Urine, Catheterized; Nasopharyngeal  Result Value Ref Range Status   SARS Coronavirus 2 NEGATIVE NEGATIVE Final    Comment: (NOTE) SARS-CoV-2 target nucleic acids are NOT DETECTED.  The SARS-CoV-2 RNA is generally detectable in upper and lower respiratory specimens during the acute phase of infection. The lowest concentration of SARS-CoV-2 viral copies this assay can detect is 250 copies / mL. A negative result does not preclude SARS-CoV-2 infection and should not be used as the sole basis for treatment or other patient management decisions.  A negative result may occur with improper specimen collection / handling, submission of specimen other than nasopharyngeal swab, presence of viral mutation(s) within the areas targeted by this assay, and inadequate number of viral copies (<250 copies / mL). A negative result must be combined with clinical observations, patient history, and epidemiological information.  Fact Sheet for Patients:   BoilerBrush.com.cy  Fact Sheet for Healthcare Providers: https://pope.com/  This test is not yet approved or   cleared by the [place] FDA and has been authorized for detection and/or diagnosis of SARS-CoV-2 by FDA under an Emergency Use Authorization (EUA).  This EUA will remain in effect (meaning this test can be used) for the duration of the COVID-19 declaration under Section 564(b)(1) of the Act, 21 U.S.C. section 360bbb-3(b)(1), unless the authorization is terminated or revoked sooner.  Performed at [facility], [address], [place], [place] [address]   MRSA PCR Screening     Status: None   Collection Time: 03/25/20  8:53 AM   Specimen: Nasal Mucosa; Nasopharyngeal  Result Value Ref Range Status   MRSA by PCR NEGATIVE NEGATIVE Final    Comment:  The GeneXpert MRSA Assay (FDA approved for NASAL specimens only), is one component of a comprehensive MRSA colonization surveillance program. It is not intended to diagnose MRSA infection nor to guide or monitor treatment for MRSA infections. Performed at [facility], [address] [place], [place] [address]   Body fluid culture (includes gram stain)     Status: None   Collection Time: 03/25/20  1:01 PM   Specimen: Peritoneal Washings; Peritoneal Fluid  Result Value Ref Range Status   Specimen Description   Final    PERITONEAL Performed at [facility], [address] [place], [place] [address]    Special Requests   Final    NONE Performed at [facility], [address], [place], [place] [address]    Gram Stain   Final    WBC PRESENT,BOTH PMN AND MONONUCLEAR NO ORGANISMS SEEN CYTOSPIN SMEAR    Culture   Final    NO GROWTH Performed at [facility], [address] [place], [place] [address]    Report Status 03/28/2020 FINAL  Final    Lab Basic Metabolic Panel: Recent Labs  Lab 03/25/20 0506 03/25/20 0619 03/26/20 0414 03/26/20 0414 03/26/20 2009 03/27/20 0509 03/28/20 0500 03/28/20 1540 03/29/20 0418  NA  --    < >  127*   < > 128* 128* 127* 133* 128*  K  --    < > 3.4*   < > 3.5 3.8 3.7 3.6 3.7  CL  --    < > 91*   < > 91* 92* 91* 95* 93*  CO2  --    < > 26   < > 14*  GLUCOSE  --    < > 156*   < > 82 121* 180* 132* 105*  BUN  --    < > 42*   < > 42* 41* 47* 52* 55*  CREATININE  --    < > 1.28*   < > 1.26* 1.67* 2.01* 2.23* 2.38*  CALCIUM  --    < > 6.0*   < > 5.9* 6.2* 6.5* 6.7* 6.2*  MG 2.8*  --  2.9*  --   --  2.5* 2.5*  --  2.6*  PHOS 4.6  --  2.4*  --   --  3.2 4.5  --  5.5*   < > = values in this interval not displayed.   Liver Function Tests: Recent Labs  Lab 03/26/20 0414 03/26/20 2009 03/27/20 0509 03/28/20 0500 03/29/20 0418  AST 542* 609* 490* 290* 310*  ALT 106* 120* 108* 80* 94*  ALKPHOS 232* 248* 219* 144* 165*  BILITOT 11.0* 13.3* 12.1* 13.8* 16.2*  PROT 3.5* 3.6* 3.2* 3.6* 4.1*  ALBUMIN 2.0* 2.0* 1.6* 1.9* 2.3*   Recent Labs  Lab 03/25/20 0506  LIPASE 55*   Recent Labs  Lab 04-17-20 2300 03/27/20 1600  AMMONIA 45* 53*   CBC: Recent Labs  Lab 04/17/2020 2300 03/25/20 0506 03/26/20 0831 03/27/20 0509 03/27/20 1600 03/28/20 0500 03/28/20 1338 03/28/20 2232 03/29/20 0416  WBC 5.9   < > 1.2*   < > 2.7* 1.7* 2.8* 4.5 5.0  NEUTROABS 5.0  --  1.0*  --   --  1.3*  --   --  3.9  HGB 9.6*   < > 6.8*   < > 5.8* 6.4* 7.4* 6.3* 7.5*  HCT 28.2*   < > 19.4*   < > 16.5* 18.1*  20.5* 17.4* 22.0*  MCV 92.2   < > 87.4   < > 88.2 85.0 85.1 86.6 90.9  PLT 150   < > 38*   < > 70* 43* 65* 79* 84*   < > = values in this interval not displayed.   Cardiac Enzymes: Recent Labs  Lab 03/25/20 0506  CKTOTAL 336   Sepsis Labs: Recent Labs  Lab 03/28/20 0500 03/28/20 0500 03/28/20 1338 03/28/20 1540 03/28/20 1813 03/28/20 2232 03/28/20 2325 03/29/20 0416 03/29/20 0418  WBC 1.7*  --  2.8*  --   --  4.5  --  5.0  --   LATICACIDVEN 3.3*   < >  --  4.5* 6.0*  --  7.0*  --  9.0*   < > = values in this interval not displayed.    Procedures/Operations   Paracentesis 03/25/2020 Central venous line placement 03/25/2020  paracentesis 03/26/2020 Interventional radiology, embolization and coiling of left inferior epigastric artery 03/27/2020  Abdulahad Mederos A Janavia Rottman 03/31/2020, 3:16 PM

## 2020-04-21 NOTE — TOC Transition Note (Signed)
Transition of Care [facility]) - CM/SW Discharge Note   Patient Details  Name: Cory Aguirre MRN: [id number] Date of Birth: [birth year]  Transition of Care [facility]) CM/SW Contact:  Golda Acre, RN Phone Number: Apr 27, 2020, 11:08 AM   Clinical Narrative:    Patient expired this am   Final next level of care: Expired Barriers to Discharge: Barriers Resolved   Patient Goals and CMS Choice Patient states their goals for this hospitalization and ongoing recovery are:: unable to state due to [facility] Medicare.gov Compare Post Acute Care list provided to:: Patient    Discharge Placement                       Discharge Plan and Services   Discharge Planning Services: CM Consult                                 Social Determinants of Health (SDOH) Interventions     Readmission Risk Interventions No flowsheet data found.

## 2020-04-21 NOTE — ED Provider Notes (Signed)
Ultrasound ED Peripheral IV (Provider)  Date/Time: 04/01/2020 7:42 PM Performed by: Rolan Bucco, MD Authorized by: Rolan Bucco, MD   Procedure details:    Indications: hypotension     Skin Prep: chlorhexidine gluconate     Location: right upper arm.   Angiocath:  20 G   Bedside Ultrasound Guided: Yes     Images: archived     Patient tolerated procedure without complications: Yes     Dressing applied: Yes        Rolan Bucco, MD 04/01/20 1942

## 2020-04-21 NOTE — Progress Notes (Signed)
Pt family requested pressors be turned off and morphine drip started per order. Pt resting comfortably in bed with family around.

## 2020-04-21 NOTE — TOC Progression Note (Signed)
Transition of Care [facility]) - Progression Note    Patient Details  Name: Cory Aguirre MRN: [id number] Date of Birth: 10/31/1955  Transition of Care [facility]) CM/SW Contact  Golda Acre, RN Phone Number: 03/23/2020, 8:28 AM  Clinical Narrative:    [place] Setting and transitioning to total comfort care,still has vasopressors in place on hold,,iv ms drip ,iv sedated. Family in room with patient.  Expected Discharge Plan: Home/Self Care Barriers to Discharge: Continued Medical Work up  Expected Discharge Plan and Services Expected Discharge Plan: Home/Self Care   Discharge Planning Services: CM Consult   Living arrangements for the past 2 months: Apartment                                       Social Determinants of Health (SDOH) Interventions    Readmission Risk Interventions No flowsheet data found.

## 2020-04-21 NOTE — Progress Notes (Signed)
Organ donor call made.

## 2020-04-21 NOTE — Progress Notes (Signed)
Continue to check on patient. He is unresponsive with agonal breaths. Pt appears comfortable with no movement or labored breathing. Family sleeping at bedside while patient rests comfortably.

## 2020-04-21 NOTE — Progress Notes (Signed)
Called to room by family.  Pt with no resp, heartbeat or pupil response for > 5 minutes.  Will notify MD.

## 2020-04-21 DEATH — deceased

## 2020-05-10 LAB — ACID FAST CULTURE WITH REFLEXED SENSITIVITIES (MYCOBACTERIA): Acid Fast Culture: NEGATIVE

## 2021-07-06 IMAGING — NM NM HEPATOBILIARY IMAGE, INC GB
2 series · 12 of 12 positions shown · non-contrast
Comparison: Ultrasound and CT 03/25/2020.

CLINICAL DATA: Chronic upper abdominal pain. Jaundice with elevated
liver function studies.

EXAM:
NUCLEAR MEDICINE HEPATOBILIARY IMAGING
TECHNIQUE: Sequential images of the abdomen were obtained [DATE] minutes
following intravenous administration of radiopharmaceutical.
RADIOPHARMACEUTICALS:  5.3 mCi Mc-FFm  Choletec IV

[Series 1: hida scan · 3.28mm/px · 6 of 60 frames shown]
[frame 6/60  full-range]
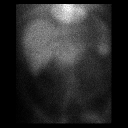
[frame 16/60  full-range]
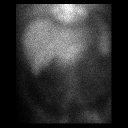
[frame 26/60  full-range]
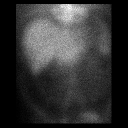
[frame 36/60  full-range]
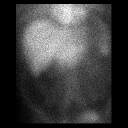
[frame 46/60  full-range]
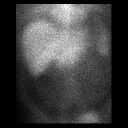
[frame 56/60  full-range]
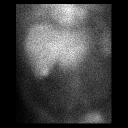

[Series 1: 2nd hour · 3.28mm/px · 6 of 60 frames shown]
[frame 6/60  full-range]
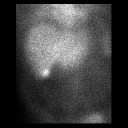
[frame 16/60  full-range]
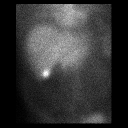
[frame 26/60  full-range]
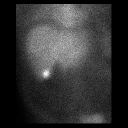
[frame 36/60  full-range]
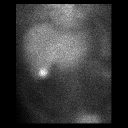
[frame 46/60  full-range]
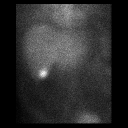
[frame 56/60  full-range]
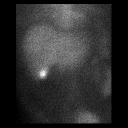

[12 of 12 positions shown; findings below may reference images not displayed]

FINDINGS: Mildly delayed hepatic uptake with limited biliary excretion.
Gallbladder activity is visualized, consistent with patency of
cystic duct. No passage of biliary activity into the small bowel is
identified, likely related to hepatic dysfunction.
IMPRESSION: 1. The cystic duct is patent with filling of the gallbladder lumen.
2. Delayed hepatic uptake and limited biliary excretion with no
passage of activity into the small bowel, likely related to hepatic
dysfunction. No biliary dilatation on recent imaging.

## 2021-07-07 IMAGING — US US RENAL
1 series · 13 of 25 positions shown · non-contrast
Comparison: CT of the abdomen and pelvis on 03/25/2020

CLINICAL DATA: Renal failure.

EXAM:
RENAL / URINARY TRACT ULTRASOUND COMPLETE

[Series 1: us renal · 13 of 40 slices shown]
[im 1/40]
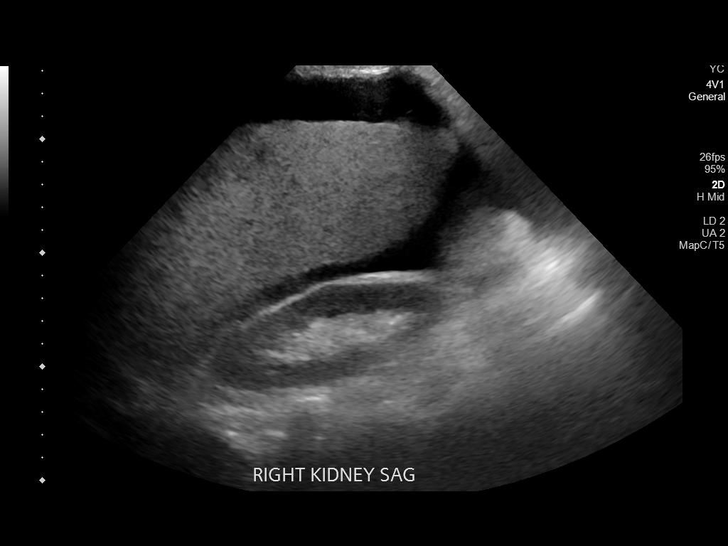
[im 4/40]
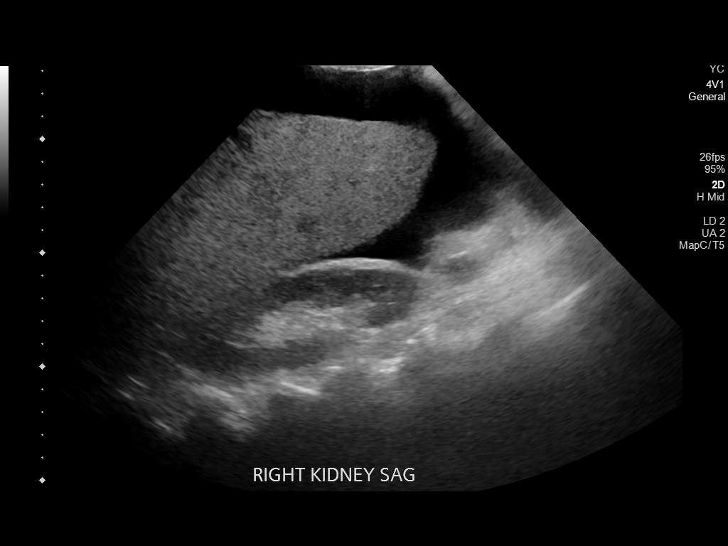
[im 7/40]
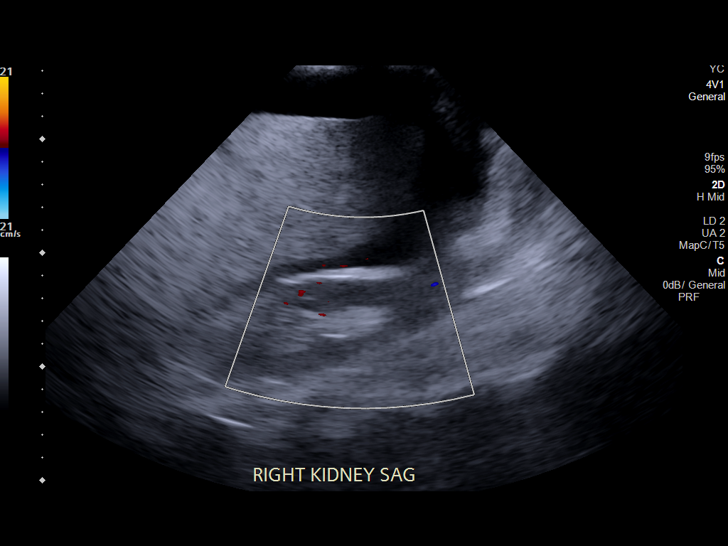
[im 10/40]
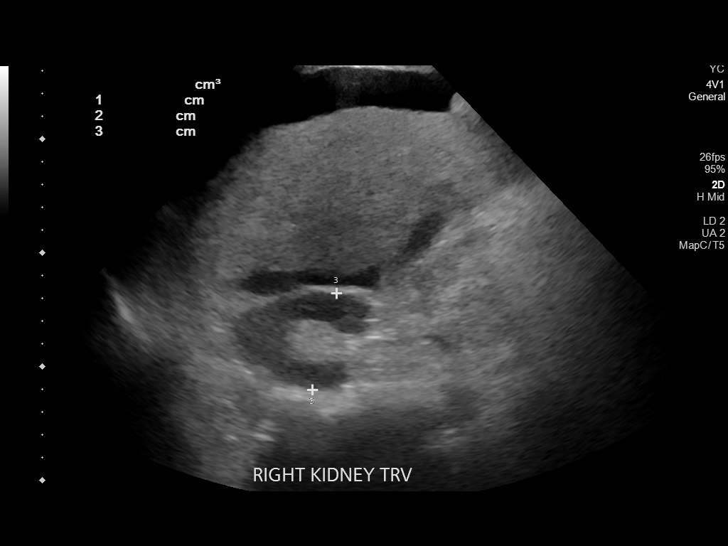
[im 14/40]
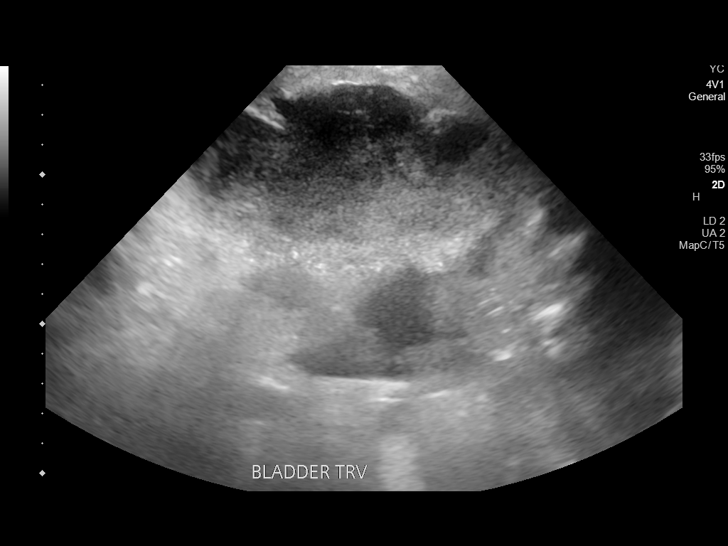
[im 17/40]
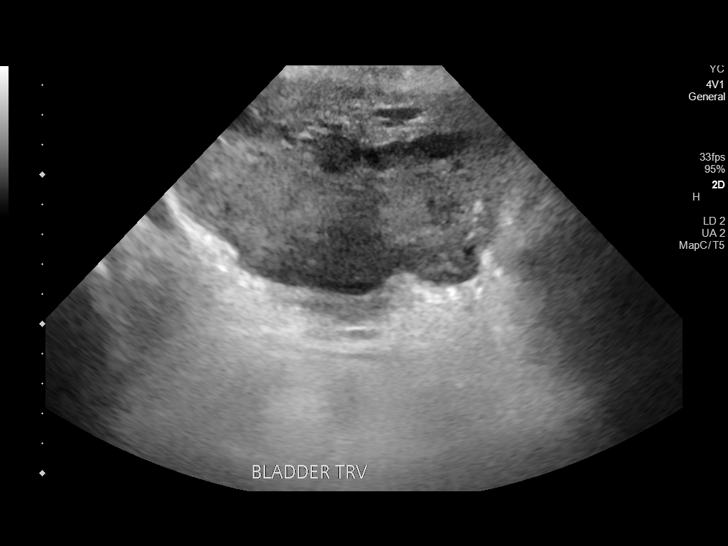
[im 20/40]
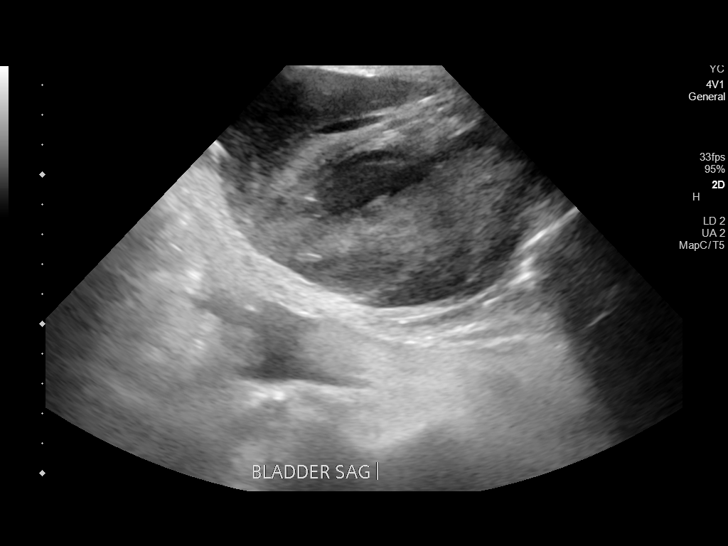
[im 23/40]
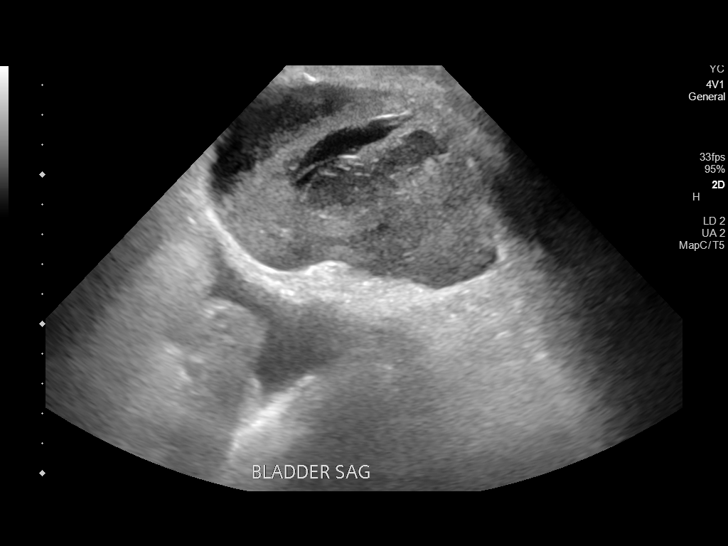
[im 27/40]
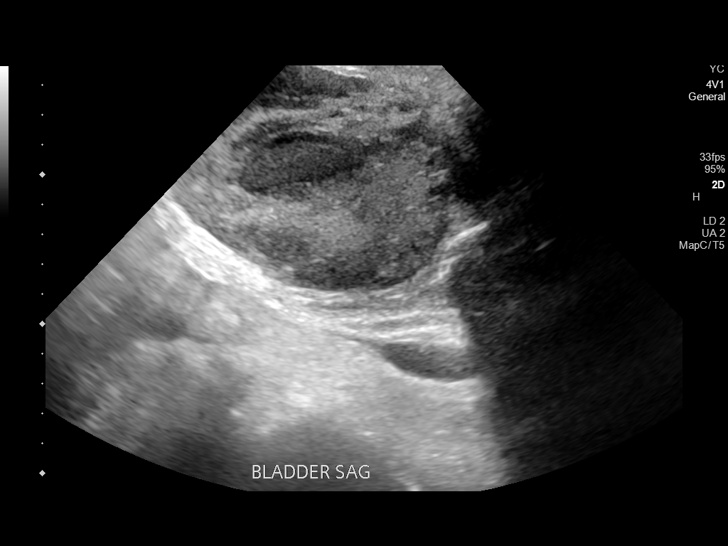
[im 30/40]
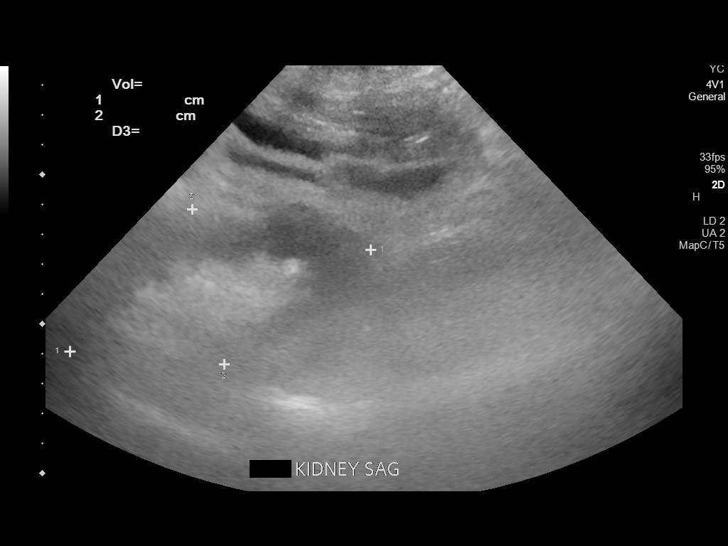
[im 33/40]
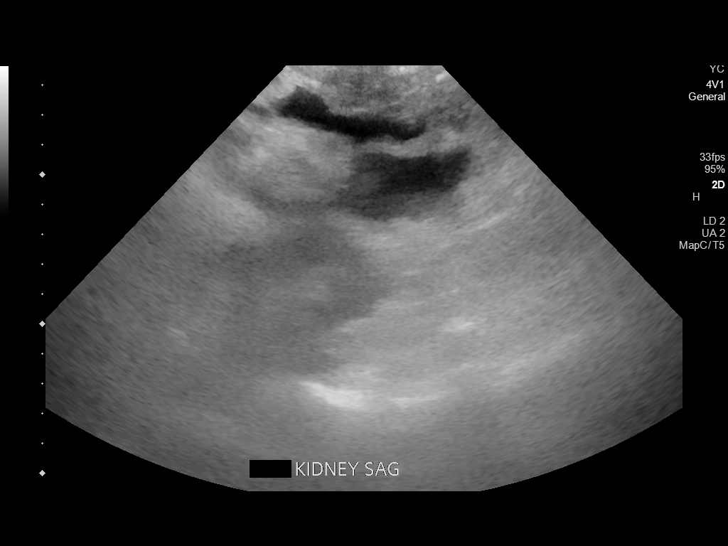
[im 36/40]
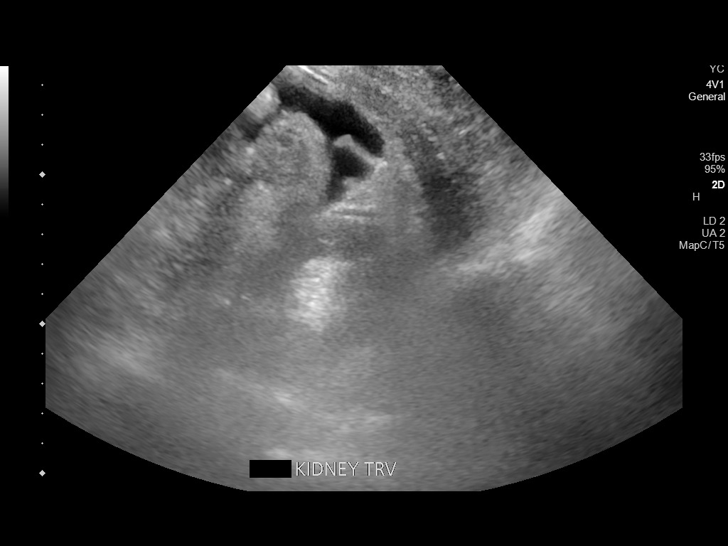
[im 40/40]
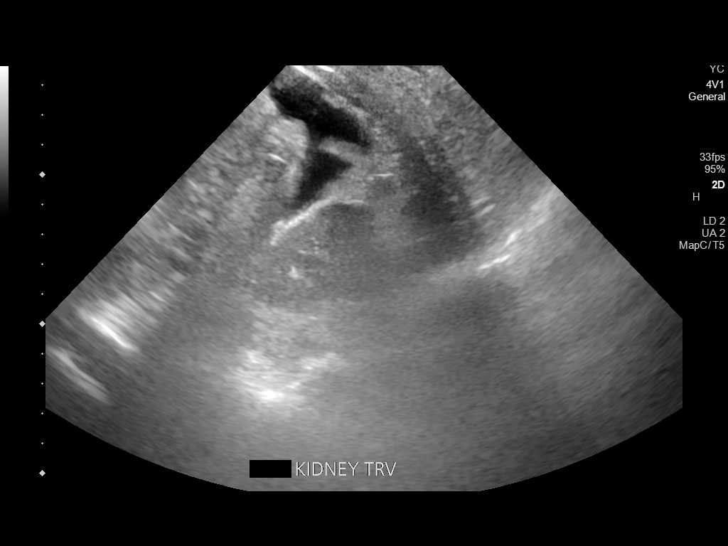

[13 of 25 positions shown; findings below may reference images not displayed]

FINDINGS: Right Kidney:

Renal measurements: 11.1 x 4.5 x 4.4 centimeters = volume: 114.5 mL
. Echogenicity within normal limits. No mass or hydronephrosis
visualized.

Left Kidney:

Renal measurements: 10.6 x 5.3 x 4.8 centimeters = volume: 140.1 mL.
Echogenicity within normal limits. No mass or hydronephrosis
visualized.

Bladder:

Heterogeneous material identified within the urinary bladder,
consistent with blood clot. A Foley catheter is not identified
within the lumen of the urinary bladder. Parallel hyperechoic linear
structures are along the posterior aspect of the bladder, raising
the question of malposition of the Foley catheter.

Other:

Ascites.
IMPRESSION: 1. No hydronephrosis or renal mass.
2. Large amount of heterogeneous material within the urinary
bladder, consistent with blood clot.
3. Foley catheter is not identified within the urinary bladder.
There are linear hyperechoic structures along the posterior aspect
of the bladder, raising the question of malposition of the Foley
catheter.
4. Ascites.

Critical Value/emergent results were called by telephone at the time
of interpretation on 03/27/2020 at [DATE] to provider Dr. Genrih Marksoo
Jovanni, who verbally acknowledged these results.
